# Patient Record
Sex: Female | Born: 1990 | Race: Black or African American | Hispanic: No | Marital: Single | Smoking: Never smoker
Health system: Southern US, Community
[De-identification: ages and names within clinical notes are randomized; demographics above are authoritative.]

## PROBLEM LIST (undated history)

## (undated) ENCOUNTER — Inpatient Hospital Stay (HOSPITAL_COMMUNITY): Payer: Self-pay

## (undated) DIAGNOSIS — D649 Anemia, unspecified: Secondary | ICD-10-CM

## (undated) HISTORY — PX: NO PAST SURGERIES: SHX2092

---

## 2014-03-03 DIAGNOSIS — O99019 Anemia complicating pregnancy, unspecified trimester: Secondary | ICD-10-CM | POA: Insufficient documentation

## 2015-01-13 NOTE — L&D Delivery Note (Signed)
25 y.o. G2P1001 at 1146w4d delivered a viable female infant in cephalic, LOA position. Loose nuchal cord x1, easily reduced at perineum. Right anterior shoulder delivered with ease. 60 sec delayed cord clamping. Cord clamped x2 and cut. Placenta delivered spontaneously intact, with 3VC. Fundus firm on exam with massage and pitocin. Good hemostasis noted.  Laceration: Bilateral superficial labial Suture: 4-0 Monocryl Good hemostasis noted.  Mom and baby recovering in LDR.    Apgars: 8/9 Weight: pending, skin to skin EBL: 150 cc   Jen MowElizabeth Mumaw, DO [facility] 10/31/2015, 9:06 AM

## 2015-05-11 ENCOUNTER — Encounter (HOSPITAL_COMMUNITY): Payer: Self-pay

## 2015-05-11 ENCOUNTER — Inpatient Hospital Stay (HOSPITAL_COMMUNITY)
Admission: AD | Admit: 2015-05-11 | Discharge: 2015-05-11 | Disposition: A | Discharge status: Home / Self Care | Payer: Medicaid Other | Source: Ambulatory Visit | Attending: Obstetrics and Gynecology | Admitting: Obstetrics and Gynecology

## 2015-05-11 DIAGNOSIS — D649 Anemia, unspecified: Secondary | ICD-10-CM

## 2015-05-11 DIAGNOSIS — B9689 Other specified bacterial agents as the cause of diseases classified elsewhere: Secondary | ICD-10-CM

## 2015-05-11 DIAGNOSIS — O23591 Infection of other part of genital tract in pregnancy, first trimester: Secondary | ICD-10-CM | POA: Insufficient documentation

## 2015-05-11 DIAGNOSIS — R109 Unspecified abdominal pain: Secondary | ICD-10-CM | POA: Diagnosis not present

## 2015-05-11 DIAGNOSIS — Z3A11 11 weeks gestation of pregnancy: Secondary | ICD-10-CM | POA: Insufficient documentation

## 2015-05-11 DIAGNOSIS — A499 Bacterial infection, unspecified: Secondary | ICD-10-CM

## 2015-05-11 DIAGNOSIS — O23592 Infection of other part of genital tract in pregnancy, second trimester: Secondary | ICD-10-CM

## 2015-05-11 DIAGNOSIS — N760 Acute vaginitis: Secondary | ICD-10-CM

## 2015-05-11 DIAGNOSIS — O99011 Anemia complicating pregnancy, first trimester: Secondary | ICD-10-CM

## 2015-05-11 DIAGNOSIS — O9989 Other specified diseases and conditions complicating pregnancy, childbirth and the puerperium: Secondary | ICD-10-CM

## 2015-05-11 DIAGNOSIS — O26891 Other specified pregnancy related conditions, first trimester: Secondary | ICD-10-CM | POA: Diagnosis not present

## 2015-05-11 DIAGNOSIS — O26899 Other specified pregnancy related conditions, unspecified trimester: Secondary | ICD-10-CM

## 2015-05-11 DIAGNOSIS — O99012 Anemia complicating pregnancy, second trimester: Secondary | ICD-10-CM

## 2015-05-11 DIAGNOSIS — R42 Dizziness and giddiness: Secondary | ICD-10-CM

## 2015-05-11 DIAGNOSIS — Z3A12 12 weeks gestation of pregnancy: Secondary | ICD-10-CM

## 2015-05-11 HISTORY — DX: Anemia, unspecified: D64.9

## 2015-05-11 LAB — CBC
HEMATOCRIT: 29.3 % — AB (ref 36.0–46.0)
Hemoglobin: 9.7 g/dL — ABNORMAL LOW (ref 12.0–15.0)
MCH: 29.8 pg (ref 26.0–34.0)
MCHC: 33.1 g/dL (ref 30.0–36.0)
MCV: 89.9 fL (ref 78.0–100.0)
Platelets: 274 10*3/uL (ref 150–400)
RBC: 3.26 MIL/uL — ABNORMAL LOW (ref 3.87–5.11)
RDW: 11.4 % — AB (ref 11.5–15.5)
WBC: 8 10*3/uL (ref 4.0–10.5)

## 2015-05-11 LAB — WET PREP, GENITAL
Sperm: NONE SEEN
TRICH WET PREP: NONE SEEN
Yeast Wet Prep HPF POC: NONE SEEN

## 2015-05-11 LAB — POCT PREGNANCY, URINE: Preg Test, Ur: POSITIVE — AB

## 2015-05-11 LAB — URINALYSIS, ROUTINE W REFLEX MICROSCOPIC
BILIRUBIN URINE: NEGATIVE
GLUCOSE, UA: NEGATIVE mg/dL
Hgb urine dipstick: NEGATIVE
Ketones, ur: NEGATIVE mg/dL
NITRITE: NEGATIVE
PH: 7 (ref 5.0–8.0)
Protein, ur: NEGATIVE mg/dL
SPECIFIC GRAVITY, URINE: 1.02 (ref 1.005–1.030)

## 2015-05-11 LAB — URINE MICROSCOPIC-ADD ON

## 2015-05-11 MED ORDER — METRONIDAZOLE 500 MG PO TABS: 500.0000 mg | ORAL_TABLET | Freq: Two times a day (BID) | ORAL | Status: DC

## 2015-05-11 NOTE — MAU Note (Addendum)
Abd cramping on and off all day. Stronger since 1700. Lightheaded and short of breath all day. Denies vag bleeding. Some yellow d/c. Only feels the cramping when sitting or if I have my pants buttoned and they are tight

## 2015-05-11 NOTE — Discharge Instructions (Signed)
Anemia, Nonspecific °Anemia is a condition in which the concentration of red blood cells or hemoglobin in the blood is below normal. Hemoglobin is a substance in red blood cells that carries oxygen to the tissues of the body. Anemia results in not enough oxygen reaching these tissues.  °CAUSES  °Common causes of anemia include:  °· Excessive bleeding. Bleeding may be internal or external. This includes excessive bleeding from periods (in women) or from the intestine.   °· Poor nutrition.   °· Chronic kidney, thyroid, and liver disease.  °· Bone marrow disorders that decrease red blood cell production. °· Cancer and treatments for cancer. °· HIV, AIDS, and their treatments. °· Spleen problems that increase red blood cell destruction. °· Blood disorders. °· Excess destruction of red blood cells due to infection, medicines, and autoimmune disorders. °SIGNS AND SYMPTOMS  °· Minor weakness.   °· Dizziness.   °· Headache. °· Palpitations.   °· Shortness of breath, especially with exercise.   °· Paleness. °· Cold sensitivity. °· Indigestion. °· Nausea. °· Difficulty sleeping. °· Difficulty concentrating. °Symptoms may occur suddenly or they may develop slowly.  °DIAGNOSIS  °Additional blood tests are often needed. These help your health care provider determine the best treatment. Your health care provider will check your stool for blood and look for other causes of blood loss.  °TREATMENT  °Treatment varies depending on the cause of the anemia. Treatment can include:  °· Supplements of iron, vitamin B12, or folic acid.   °· Hormone medicines.   °· A blood transfusion. This may be needed if blood loss is severe.   °· Hospitalization. This may be needed if there is significant continual blood loss.   °· Dietary changes. °· Spleen removal. °HOME CARE INSTRUCTIONS °Keep all follow-up appointments. It often takes many weeks to correct anemia, and having your health care provider check on your condition and your response to  treatment is very important. °SEEK IMMEDIATE MEDICAL CARE IF:  °· You develop extreme weakness, shortness of breath, or chest pain.   °· You become dizzy or have trouble concentrating. °· You develop heavy vaginal bleeding.   °· You develop a rash.   °· You have bloody or black, tarry stools.   °· You faint.   °· You vomit up blood.   °· You vomit repeatedly.   °· You have abdominal pain. °· You have a fever or persistent symptoms for more than 2-3 days.   °· You have a fever and your symptoms suddenly get worse.   °· You are dehydrated.   °MAKE SURE YOU: °· Understand these instructions. °· Will watch your condition. °· Will get help right away if you are not doing well or get worse. °  °This information is not intended to replace advice given to you by your health care provider. Make sure you discuss any questions you have with your health care provider. °  °Document Released: 02/06/2004 Document Revised: 08/31/2012 Document Reviewed: 06/24/2012 °Elsevier Interactive Patient Education ©2016 Elsevier Inc. ° ° °Bacterial Vaginosis °Bacterial vaginosis is a vaginal infection that occurs when the normal balance of bacteria in the vagina is disrupted. It results from an overgrowth of certain bacteria. This is the most common vaginal infection in women of childbearing age. Treatment is important to prevent complications, especially in pregnant women, as it can cause a premature delivery. °CAUSES  °Bacterial vaginosis is caused by an increase in harmful bacteria that are normally present in smaller amounts in the vagina. Several different kinds of bacteria can cause bacterial vaginosis. However, the reason that the condition develops is not fully understood. °RISK FACTORS °  or behaviors can put you at an increased risk of developing bacterial vaginosis, including:  Having a new sex partner or multiple sex partners.  Douching.  Using an intrauterine device (IUD) for contraception. Women do not get  bacterial vaginosis from toilet seats, bedding, swimming pools, or contact with objects around them. SIGNS AND SYMPTOMS  Some women with bacterial vaginosis have no signs or symptoms. Common symptoms include:  Grey vaginal discharge.  A fishlike odor with discharge, especially after sexual intercourse.  Itching or burning of the vagina and vulva.  Burning or pain with urination. DIAGNOSIS  Your health care provider will take a medical history and examine the vagina for signs of bacterial vaginosis. A sample of vaginal fluid may be taken. Your health care provider will look at this sample under a microscope to check for bacteria and abnormal cells. A vaginal pH test may also be done.  TREATMENT  Bacterial vaginosis may be treated with antibiotic medicines. These may be given in the form of a pill or a vaginal cream. A second round of antibiotics may be prescribed if the condition comes back after treatment. Because bacterial vaginosis increases your risk for sexually transmitted diseases, getting treated can help reduce your risk for chlamydia, gonorrhea, HIV, and herpes. HOME CARE INSTRUCTIONS   Only take over-the-counter or prescription medicines as directed by your health care provider.  If antibiotic medicine was prescribed, take it as directed. Make sure you finish it even if you start to feel better.  Tell all sexual partners that you have a vaginal infection. They should see their health care provider and be treated if they have problems, such as a mild rash or itching.  During treatment, it is important that you follow these instructions:  Avoid sexual activity or use condoms correctly.  Do not douche.  Avoid alcohol as directed by your health care provider.  Avoid breastfeeding as directed by your health care provider. SEEK MEDICAL CARE IF:   Your symptoms are not improving after 3 days of treatment.  You have increased discharge or pain.  You have a fever. MAKE SURE  YOU:   Understand these instructions.  Will watch your condition.  Will get help right away if you are not doing well or get worse. FOR MORE INFORMATION  Centers for Disease Control and Prevention, Division of STD Prevention: SolutionApps.co.zawww.cdc.gov/std American Sexual Health Association (ASHA): www.ashastd.org    This information is not intended to replace advice given to you by your health care provider. Make sure you discuss any questions you have with your health care provider.   Document Released: 12/29/2004 Document Revised: 01/19/2014 Document Reviewed: 08/10/2012 Elsevier Interactive Patient Education [employer]2016 Elsevier Inc.

## 2015-05-11 NOTE — MAU Provider Note (Signed)
History     CSN: [id number]  Arrival date and time: 05/11/15 0101   First Provider Initiated Contact with Patient 05/11/15 0151      Chief Complaint  Patient presents with  . Abdominal Cramping  . Dizziness   HPI   [name] is a 25 y.o. female with a history of anemia G2P1001 at 3046w4d presenting with complaints of dizziness that she has felt more since she found out she was pregnant. She feels dizzy upon standing almost every time.  She has never passed out. She feels like she eats a healthy diet.  No history of cardiac problems. She had this dizziness with her last pregnancy however never followed up.   She has been experiencing lower abdominal pain that started 2 days ago. It worsens when she stands up or change positions. She tried taking tylenol extra strength which has not helped.   OB History    Gravida Para Term Preterm AB TAB SAB Ectopic Multiple Living   2 1 1       1       Past Medical History  Diagnosis Date  . Anemia     History reviewed. No pertinent past surgical history.  History reviewed. No pertinent family history.  Social History  Substance Use Topics  . Smoking status: Never Smoker   . Smokeless tobacco: None  . Alcohol Use: No    Allergies: No Known Allergies  No prescriptions prior to admission   Results for orders placed or performed during the hospital encounter of 05/11/15 (from the past 72 hour(s))  Urinalysis, Routine w reflex microscopic (not at [facility])     Status: Abnormal   Collection Time: 05/11/15  1:25 AM  Result Value Ref Range   Color, Urine YELLOW YELLOW   APPearance CLEAR CLEAR   Specific Gravity, Urine 1.020 1.005 - 1.030   pH 7.0 5.0 - 8.0   Glucose, UA NEGATIVE NEGATIVE mg/dL   Hgb urine dipstick NEGATIVE NEGATIVE   Bilirubin Urine NEGATIVE NEGATIVE   Ketones, ur NEGATIVE NEGATIVE mg/dL   Protein, ur NEGATIVE NEGATIVE mg/dL   Nitrite NEGATIVE NEGATIVE   Leukocytes, UA TRACE (A) NEGATIVE  Urine microscopic-add on      Status: Abnormal   Collection Time: 05/11/15  1:25 AM  Result Value Ref Range   Squamous Epithelial / LPF 0-5 (A) NONE SEEN   WBC, UA 0-5 0 - 5 WBC/hpf   RBC / HPF 0-5 0 - 5 RBC/hpf   Bacteria, UA FEW (A) NONE SEEN  Pregnancy, urine POC     Status: Abnormal   Collection Time: 05/11/15  1:41 AM  Result Value Ref Range   Preg Test, Ur POSITIVE (A) NEGATIVE    Comment:        THE SENSITIVITY OF THIS METHODOLOGY IS >24 mIU/mL   CBC     Status: Abnormal   Collection Time: 05/11/15  1:54 AM  Result Value Ref Range   WBC 8.0 4.0 - 10.5 K/uL   RBC 3.26 (L) 3.87 - 5.11 MIL/uL   Hemoglobin 9.7 (L) 12.0 - 15.0 g/dL   HCT 65.729.3 (L) 84.636.0 - 96.246.0 %   MCV 89.9 78.0 - 100.0 fL   MCH 29.8 26.0 - 34.0 pg   MCHC 33.1 30.0 - 36.0 g/dL   RDW 95.211.4 (L) 84.111.5 - 32.415.5 %   Platelets 274 150 - 400 K/uL  Wet prep, genital     Status: Abnormal   Collection Time: 05/11/15  2:05 AM  Result Value  Ref Range   Yeast Wet Prep HPF POC NONE SEEN NONE SEEN   Trich, Wet Prep NONE SEEN NONE SEEN   Clue Cells Wet Prep HPF POC PRESENT (A) NONE SEEN   WBC, Wet Prep HPF POC MANY (A) NONE SEEN    Comment: MANY BACTERIA SEEN   Sperm NONE SEEN     Review of Systems  Constitutional: Negative for fever and chills.  Respiratory: Negative for shortness of breath (None now, has had occasional with this pregnancy. ).   Gastrointestinal: Positive for abdominal pain. Negative for nausea and vomiting.  Genitourinary: Positive for frequency. Negative for dysuria and urgency.   Physical Exam   Blood pressure 108/67, pulse 80, temperature 98.1 F (36.7 C), resp. rate 20, height  (1.6 m), weight 126 lb 3.2 oz (57.244 kg), last menstrual period 02/19/2015, SpO2 100 %.  Physical Exam  Constitutional: She is oriented to person, place, and time. She appears well-developed and well-nourished. No distress.  HENT:  Head: Normocephalic.  Eyes: Pupils are equal, round, and reactive to light.  GI: Soft. She exhibits no  distension. There is no tenderness. There is no rebound and no guarding.  Genitourinary:  Wet prep/GC collected with speculum Bimanual exam: cervix closed-posterior   Musculoskeletal: Normal range of motion.  Neurological: She is alert and oriented to person, place, and time.  Skin: Skin is warm. She is not diaphoretic.  Psychiatric: Her behavior is normal.    MAU Course  Procedures  None  MDM  + fetal hear tones via doppler.  Orthostatic vitals normal  Assessment and Plan   A:  1. Anemia in pregnancy, first trimester   2. Abdominal cramping affecting pregnancy   3. Dizzinesses   4. Bacterial vaginosis      P:  Discharge home in stable condition Increase PO iron to BID  Change positions slowly  Increase PO fluid intake  RX: Flagyl    Duane Lope, NP  05/13/2015, 12:12 PM

## 2015-05-11 NOTE — Progress Notes (Signed)
Standing at 0

## 2015-05-11 NOTE — Progress Notes (Signed)
Standing at 3

## 2015-05-13 LAB — GC/CHLAMYDIA PROBE AMP (~~LOC~~) NOT AT ARMC
CHLAMYDIA, DNA PROBE: NEGATIVE
Neisseria Gonorrhea: NEGATIVE

## 2015-05-21 ENCOUNTER — Ambulatory Visit (INDEPENDENT_AMBULATORY_CARE_PROVIDER_SITE_OTHER): Payer: Medicaid Other | Admitting: Certified Nurse Midwife

## 2015-05-21 ENCOUNTER — Encounter: Payer: Self-pay | Admitting: Certified Nurse Midwife

## 2015-05-21 VITALS — Wt 124.0 lb

## 2015-05-21 DIAGNOSIS — Z3490 Encounter for supervision of normal pregnancy, unspecified, unspecified trimester: Secondary | ICD-10-CM | POA: Insufficient documentation

## 2015-05-21 DIAGNOSIS — O99012 Anemia complicating pregnancy, second trimester: Secondary | ICD-10-CM

## 2015-05-21 DIAGNOSIS — Z3492 Encounter for supervision of normal pregnancy, unspecified, second trimester: Secondary | ICD-10-CM

## 2015-05-21 DIAGNOSIS — Z3482 Encounter for supervision of other normal pregnancy, second trimester: Secondary | ICD-10-CM

## 2015-05-21 LAB — POCT URINALYSIS DIPSTICK
Bilirubin, UA: NEGATIVE
Blood, UA: NEGATIVE
Glucose, UA: NEGATIVE
Ketones, UA: NEGATIVE
Leukocytes, UA: NEGATIVE
Nitrite, UA: NEGATIVE
PH UA: 5
PROTEIN UA: NEGATIVE
SPEC GRAV UA: 1.015
UROBILINOGEN UA: NEGATIVE

## 2015-05-21 MED ORDER — VITAFOL FE+ 90-1-200 & 50 MG PO CPPK: 2.0000 | ORAL_CAPSULE | Freq: Every day | ORAL | Status: DC

## 2015-05-21 MED ORDER — IRON POLYSACCH CMPLX-B12-FA 150-0.025-1 MG PO CAPS: 1.0000 | ORAL_CAPSULE | Freq: Every day | ORAL | Status: DC

## 2015-05-21 NOTE — Progress Notes (Signed)
Subjective:    Hannah Patel is being seen today for her first obstetrical visit.  This is not a planned pregnancy. She is at 55106w0d gestation. Her obstetrical history is significant for none. Relationship with FOB: significant other, living together. Patient does intend to breast feed. Pregnancy history fully reviewed.  Was on OCPs, desires to continue with OCPs.    The information documented in the HPI was reviewed and verified.  Menstrual History: OB History    Gravida Para Term Preterm AB TAB SAB Ectopic Multiple Living   2 1 1       1       Menarche age: [age]- 25 years of age  Patient's last menstrual period was 02/19/2015 (lmp unknown).    Past Medical History  Diagnosis Date  . Anemia     No past surgical history on file.   (Not in a hospital admission) No Known Allergies  Social History  Substance Use Topics  . Smoking status: Never Smoker   . Smokeless tobacco: Not on file  . Alcohol Use: No    No family history on file.   Review of Systems Constitutional: negative for weight loss, + virtigo Gastrointestinal: negative for vomiting Genitourinary:negative for genital lesions and vaginal discharge and dysuria Musculoskeletal:negative for back pain Behavioral/Psych: negative for abusive relationship, depression, illegal drug usage and tobacco use    Objective:    Wt 124 lb (56.246 kg)  LMP 02/19/2015 (LMP Unknown) General Appearance:    Alert, cooperative, no distress, appears stated age  Head:    Normocephalic, without obvious abnormality, atraumatic  Eyes:    PERRL, conjunctiva/corneas clear, EOM's intact, fundi    benign, both eyes  Ears:    Normal TM's and external ear canals, both ears  Nose:   Nares normal, septum midline, mucosa normal, no drainage    or sinus tenderness  Throat:   Lips, mucosa, and tongue normal; teeth and gums normal  Neck:   Supple, symmetrical, trachea midline, no adenopathy;    thyroid:  no enlargement/tenderness/nodules; no carotid  bruit or JVD  Back:     Symmetric, no curvature, ROM normal, no CVA tenderness  Lungs:     Clear to auscultation bilaterally, respirations unlabored  Chest Wall:    No tenderness or deformity   Heart:    Regular rate and rhythm, S1 and S2 normal, no murmur, rub   or gallop  Breast Exam:    No tenderness, masses, or nipple abnormality  Abdomen:     Soft, non-tender, bowel sounds active all four quadrants,    no masses, no organomegaly  Genitalia:    Normal female without lesion, discharge or tenderness  Extremities:   Extremities normal, atraumatic, no cyanosis or edema  Pulses:   2+ and symmetric all extremities  Skin:   Skin color, texture, turgor normal, no rashes or lesions  Lymph nodes:   Cervical, supraclavicular, and axillary nodes normal  Neurologic:   CNII-XII intact, normal strength, sensation and reflexes    throughout          Cervix:  Long, thick, closed and posterior.  FHR: 155 by doppler     Lab Review Urine pregnancy test Labs reviewed yes Radiologic studies reviewed no Assessment:    Pregnancy at 75106w0d weeks   N&V in early pregnancy  Chronic anemia  Plan:      Prenatal vitamins.  Counseling provided regarding continued use of seat belts, cessation of alcohol consumption, smoking or use of illicit drugs; infection precautions i.e.,  influenza/TDAP immunizations, toxoplasmosis,CMV, parvovirus, listeria and varicella; workplace safety, exercise during pregnancy; routine dental care, safe medications, sexual activity, hot tubs, saunas, pools, travel, caffeine use, fish and methlymercury, potential toxins, hair treatments, varicose veins Weight gain recommendations per IOM guidelines reviewed: underweight/BMI< 18.5--> gain 28 - 40 lbs; normal weight/BMI 18.5 - 24.9--> gain 25 - 35 lbs; overweight/BMI 25 - 29.9--> gain 15 - 25 lbs; obese/BMI >30->gain  11 - 20 lbs Problem list reviewed and updated. FIRST/CF mutation testing/NIPT/QUAD SCREEN/fragile X/Ashkenazi Jewish  population testing/Spinal muscular atrophy discussed: requested. Role of ultrasound in pregnancy discussed; fetal survey: requested. Amniocentesis discussed: not indicated. VBAC calculator score: VBAC consent form provided Meds ordered this encounter  Medications  . ferrous sulfate 325 (65 FE) MG tablet    Sig: Take 325 mg by mouth 2 (two) times daily with a meal.  . Prenat-FePoly-Metf-FA-DHA-DSS (VITAFOL FE+) 90-1-200 & 50 MG CPPK    Sig: Take 2 tablets by mouth daily.    Dispense:  60 each    Refill:  12   Orders Placed This Encounter  Procedures  . US OB Comp Less 14 Wks    Standing Status: Future     Number of Occurrences:      Standing Expiration Date: 07/20/2016    Order Specific Question:  Reason for Exam (SYMPTOM  OR DIAGNOSIS REQUIRED)    Answer:  dating    Order Specific Question:  Preferred imaging location?    Answer:  Internal  . POCT Urinalysis Dipstick    Follow up in 4 weeks. 50% of 30 min visit spent on counseling and coordination of care.

## 2015-05-23 LAB — PRENATAL PROFILE I(LABCORP)
Antibody Screen: NEGATIVE
BASOS ABS: 0 10*3/uL (ref 0.0–0.2)
Basos: 0 %
EOS (ABSOLUTE): 0.1 10*3/uL (ref 0.0–0.4)
Eos: 1 %
HEMOGLOBIN: 10.5 g/dL — AB (ref 11.1–15.9)
Hematocrit: 32.2 % — ABNORMAL LOW (ref 34.0–46.6)
Hepatitis B Surface Ag: NEGATIVE
IMMATURE GRANS (ABS): 0 10*3/uL (ref 0.0–0.1)
IMMATURE GRANULOCYTES: 0 %
Lymphocytes Absolute: 2.1 10*3/uL (ref 0.7–3.1)
Lymphs: 27 %
MCH: 29.4 pg (ref 26.6–33.0)
MCHC: 32.6 g/dL (ref 31.5–35.7)
MCV: 90 fL (ref 79–97)
MONOCYTES: 5 %
MONOS ABS: 0.4 10*3/uL (ref 0.1–0.9)
Neutrophils Absolute: 5 10*3/uL (ref 1.4–7.0)
Neutrophils: 67 %
Platelets: 317 10*3/uL (ref 150–379)
RBC: 3.57 x10E6/uL — ABNORMAL LOW (ref 3.77–5.28)
RDW: 11.9 % — AB (ref 12.3–15.4)
RPR Ser Ql: NONREACTIVE
RUBELLA: 5.47 {index} (ref >0.99)
Rh Factor: POSITIVE
WBC: 7.6 10*3/uL (ref 3.4–10.8)

## 2015-05-23 LAB — HEMOGLOBINOPATHY EVALUATION
HEMOGLOBIN F QUANTITATION: 0 % (ref 0.0–2.0)
HGB A: 97.5 % (ref 94.0–98.0)
HGB C: 0 %
HGB S: 0 %
Hemoglobin A2 Quantitation: 2.5 % (ref 0.7–3.1)

## 2015-05-23 LAB — TSH: TSH: 0.494 u[IU]/mL (ref 0.450–4.500)

## 2015-05-23 LAB — HIV ANTIBODY (ROUTINE TESTING W REFLEX): HIV Screen 4th Generation wRfx: NONREACTIVE

## 2015-05-23 LAB — CULTURE, OB URINE

## 2015-05-23 LAB — VARICELLA ZOSTER ANTIBODY, IGG: VARICELLA: 3106 {index} (ref >165)

## 2015-05-23 LAB — URINE CULTURE, OB REFLEX

## 2015-05-24 LAB — PAP IG W/ RFLX HPV ASCU: PAP Smear Comment: 0

## 2015-05-25 LAB — NUSWAB VG+, CANDIDA 6SP
CANDIDA KRUSEI, NAA: NEGATIVE
CANDIDA LUSITANIAE, NAA: NEGATIVE
CANDIDA PARAPSILOSIS, NAA: NEGATIVE
CANDIDA TROPICALIS, NAA: NEGATIVE
Candida albicans, NAA: NEGATIVE
Candida glabrata, NAA: NEGATIVE
Chlamydia trachomatis, NAA: NEGATIVE
Neisseria gonorrhoeae, NAA: NEGATIVE
TRICH VAG BY NAA: NEGATIVE

## 2015-05-28 LAB — MATERNIT21 PLUS CORE+SCA
CHROMOSOME 13: NEGATIVE
CHROMOSOME 18: NEGATIVE
CHROMOSOME 21: NEGATIVE
PDF: 0
Y CHROMOSOME: NOT DETECTED

## 2015-05-29 ENCOUNTER — Other Ambulatory Visit: Payer: Self-pay | Admitting: Certified Nurse Midwife

## 2015-05-30 ENCOUNTER — Other Ambulatory Visit: Payer: Self-pay | Admitting: Certified Nurse Midwife

## 2015-05-30 ENCOUNTER — Ambulatory Visit (INDEPENDENT_AMBULATORY_CARE_PROVIDER_SITE_OTHER): Payer: Medicaid Other

## 2015-05-30 DIAGNOSIS — O3680X1 Pregnancy with inconclusive fetal viability, fetus 1: Secondary | ICD-10-CM

## 2015-05-30 DIAGNOSIS — Z3482 Encounter for supervision of other normal pregnancy, second trimester: Secondary | ICD-10-CM

## 2015-05-31 ENCOUNTER — Other Ambulatory Visit: Payer: Self-pay | Admitting: Certified Nurse Midwife

## 2015-05-31 DIAGNOSIS — Z3482 Encounter for supervision of other normal pregnancy, second trimester: Secondary | ICD-10-CM

## 2015-06-18 ENCOUNTER — Encounter: Payer: Medicaid Other | Admitting: Certified Nurse Midwife

## 2015-06-25 ENCOUNTER — Encounter: Payer: Medicaid Other | Admitting: Certified Nurse Midwife

## 2015-06-25 ENCOUNTER — Other Ambulatory Visit: Payer: Medicaid Other

## 2015-07-04 ENCOUNTER — Ambulatory Visit (INDEPENDENT_AMBULATORY_CARE_PROVIDER_SITE_OTHER): Payer: Medicaid Other | Admitting: Certified Nurse Midwife

## 2015-07-04 ENCOUNTER — Encounter: Payer: Self-pay | Admitting: Certified Nurse Midwife

## 2015-07-04 ENCOUNTER — Ambulatory Visit (INDEPENDENT_AMBULATORY_CARE_PROVIDER_SITE_OTHER): Payer: Medicaid Other

## 2015-07-04 VITALS — BP 101/60 | HR 77 | Wt 130.0 lb

## 2015-07-04 DIAGNOSIS — Z3482 Encounter for supervision of other normal pregnancy, second trimester: Secondary | ICD-10-CM

## 2015-07-04 DIAGNOSIS — R51 Headache: Secondary | ICD-10-CM

## 2015-07-04 DIAGNOSIS — O26892 Other specified pregnancy related conditions, second trimester: Secondary | ICD-10-CM

## 2015-07-04 DIAGNOSIS — Z36 Encounter for antenatal screening of mother: Secondary | ICD-10-CM

## 2015-07-04 DIAGNOSIS — R519 Headache, unspecified: Secondary | ICD-10-CM

## 2015-07-04 MED ORDER — BUTALBITAL-APAP-CAFFEINE 50-325-40 MG PO TABS: 1.0000 | ORAL_TABLET | Freq: Four times a day (QID) | ORAL | Status: DC | PRN

## 2015-07-04 NOTE — Progress Notes (Signed)
Subjective:    Hannah Patel is a 25 y.o. female being seen today for her obstetrical visit. She is at 6966w4d gestation. Patient reports: headache, no bleeding, no contractions, no cramping and no leaking . Fetal movement: normal.  Reports daily headaches without relief with Tylenol.  Problem List Items Addressed This Visit    None    Visit Diagnoses    Headache in pregnancy, antepartum, second trimester    -  Primary    Relevant Medications    butalbital-acetaminophen-caffeine (FIORICET) 50-325-40 MG tablet      Patient Active Problem List   Diagnosis Date Noted  . Encounter for supervision of other normal pregnancy in second trimester 05/21/2015   Objective:    BP 101/60 mmHg  Pulse 77  Wt 130 lb (58.968 kg)  LMP 02/19/2015 (LMP Unknown) FHT: 148 BPM  Uterine Size: size equals dates     Assessment:    Pregnancy @ 966w4d    Headache Plan:    OBGCT: discussed. Signs and symptoms of preterm labor: discussed.  Labs, problem list reviewed and updated 2 hr GTT planned Follow up in 4 weeks.

## 2015-07-05 NOTE — Progress Notes (Signed)
I agree with note by NP Student Andrew Brake.  Was present for exam.  R.Shauntel Prest CNM 

## 2015-07-31 ENCOUNTER — Ambulatory Visit (INDEPENDENT_AMBULATORY_CARE_PROVIDER_SITE_OTHER): Payer: Medicaid Other | Admitting: Certified Nurse Midwife

## 2015-07-31 VITALS — BP 98/62 | HR 78 | Wt 133.0 lb

## 2015-07-31 DIAGNOSIS — R51 Headache: Secondary | ICD-10-CM

## 2015-07-31 DIAGNOSIS — R519 Headache, unspecified: Secondary | ICD-10-CM

## 2015-07-31 DIAGNOSIS — Z3482 Encounter for supervision of other normal pregnancy, second trimester: Secondary | ICD-10-CM

## 2015-07-31 DIAGNOSIS — O26892 Other specified pregnancy related conditions, second trimester: Secondary | ICD-10-CM

## 2015-07-31 MED ORDER — BUTALBITAL-APAP-CAFFEINE 50-325-40 MG PO TABS: 1.0000 | ORAL_TABLET | Freq: Four times a day (QID) | ORAL | Status: DC | PRN

## 2015-07-31 NOTE — Progress Notes (Signed)
Subjective:    Hannah Patel is a 25 y.o. female being seen today for her obstetrical visit. She is at 1946w3d gestation. Patient reports: no complaints . Lost her fioricet RX, reprinted for her.  Fetal movement: normal.  Problem List Items Addressed This Visit      Other   Encounter for supervision of other normal pregnancy in second trimester - Primary   Relevant Orders   POCT urinalysis dipstick    Other Visit Diagnoses    Headache in pregnancy, antepartum, second trimester        Relevant Medications    butalbital-acetaminophen-caffeine (FIORICET) 50-325-40 MG tablet      Patient Active Problem List   Diagnosis Date Noted  . Encounter for supervision of other normal pregnancy in second trimester 05/21/2015   Objective:    BP 98/62 mmHg  Pulse 78  Wt 133 lb (60.328 kg)  LMP 02/19/2015 (LMP Unknown) FHT: 155 BPM  Uterine Size: size equals dates     Assessment:    Pregnancy @ 4746w3d    Doing well  Plan:    OBGCT: discussed and ordered for next visit. Signs and symptoms of preterm labor: discussed.  Labs, problem list reviewed and updated 2 hr GTT planned Follow up in 2 weeks.

## 2015-08-14 ENCOUNTER — Other Ambulatory Visit: Payer: Medicaid Other

## 2015-08-14 ENCOUNTER — Ambulatory Visit (INDEPENDENT_AMBULATORY_CARE_PROVIDER_SITE_OTHER): Payer: Medicaid Other | Admitting: Certified Nurse Midwife

## 2015-08-14 VITALS — BP 102/69 | HR 134 | Temp 98.3°F | Wt 137.4 lb

## 2015-08-14 DIAGNOSIS — Z3492 Encounter for supervision of normal pregnancy, unspecified, second trimester: Secondary | ICD-10-CM

## 2015-08-14 DIAGNOSIS — Z3482 Encounter for supervision of other normal pregnancy, second trimester: Secondary | ICD-10-CM

## 2015-08-14 LAB — POCT URINALYSIS DIPSTICK
Bilirubin, UA: NEGATIVE
Blood, UA: NEGATIVE
Glucose, UA: NEGATIVE
KETONES UA: NEGATIVE
Nitrite, UA: NEGATIVE
PH UA: 6
PROTEIN UA: NEGATIVE
SPEC GRAV UA: 1.015
UROBILINOGEN UA: 0.2

## 2015-08-14 MED ORDER — TETANUS-DIPHTH-ACELL PERTUSSIS 5-2.5-18.5 LF-MCG/0.5 IM SUSP
0.5000 mL | Freq: Once | INTRAMUSCULAR | Status: AC
  Administered 2015-08-14: 0.5 mL via INTRAMUSCULAR

## 2015-08-14 NOTE — Addendum Note (Signed)
Addended by: Lear Ng on: 08/14/2015 11:10 AM   Modules accepted: Orders

## 2015-08-14 NOTE — Progress Notes (Signed)
Subjective:  Hannah Patel is a 25 y.o. G2P1001 at [redacted]w[redacted]d being seen today for ongoing prenatal care.  She is currently monitored for the following issues for this low-risk pregnancy and has Encounter for supervision of other normal pregnancy in second trimester on her problem list.  Patient reports no complaints.  Contractions: Not present. Vag. Bleeding: None.  Movement: Present. Denies leaking of fluid.   The following portions of the patient's history were reviewed and updated as appropriate: allergies, current medications, past family history, past medical history, past social history, past surgical history and problem list. Problem list updated.  Objective:   Vitals:   08/14/15 0842  BP: 102/69  Pulse: (!) 134  Temp: 98.3 F (36.8 C)  Weight: 137 lb 6.4 oz (62.3 kg)    Fetal Status: Fetal Heart Rate (bpm): 145 Fundal Height: 27 cm Movement: Present     General:  Alert, oriented and cooperative. Patient is in no acute distress.  Skin: Skin is warm and dry. No rash noted.   Cardiovascular: Normal heart rate noted  Respiratory: Normal respiratory effort, no problems with respiration noted  Abdomen: Soft, gravid, appropriate for gestational age. Pain/Pressure: Absent     Pelvic:  Cervical exam deferred        Extremities: Normal range of motion.  Edema: None  Mental Status: Normal mood and affect. Normal behavior. Normal judgment and thought content.   Urinalysis:      Assessment and Plan:  Pregnancy: G2P1001 at [redacted]w[redacted]d  1. Encounter for supervision of other normal pregnancy in second trimester - 28 weeks labs - TDaP  Preterm labor symptoms and general obstetric precautions including but not limited to vaginal bleeding, contractions, leaking of fluid and fetal movement were reviewed in detail with the patient. Please refer to After Visit Summary for other counseling recommendations.  F/U 2 weeks   Dorathy Kinsman, [place]   Patient ID: Hannah Patel, female   DOB: 03-05-1990, 25  y.o.   MRN: [id number]

## 2015-08-14 NOTE — Progress Notes (Signed)
Pt states everything is "fine" at this point.

## 2015-08-14 NOTE — Addendum Note (Signed)
Addended by: Elby Beck F on: 08/14/2015 04:57 PM   Modules accepted: Orders

## 2015-08-14 NOTE — Patient Instructions (Signed)

## 2015-08-15 LAB — CBC
HEMATOCRIT: 31.7 % — AB (ref 34.0–46.6)
Hemoglobin: 9.8 g/dL — ABNORMAL LOW (ref 11.1–15.9)
MCH: 29.8 pg (ref 26.6–33.0)
MCHC: 30.9 g/dL — AB (ref 31.5–35.7)
MCV: 96 fL (ref 79–97)
Platelets: 237 10*3/uL (ref 150–379)
RBC: 3.29 x10E6/uL — ABNORMAL LOW (ref 3.77–5.28)
RDW: 12.3 % (ref 12.3–15.4)
WBC: 9.1 10*3/uL (ref 3.4–10.8)

## 2015-08-15 LAB — RPR: RPR Ser Ql: NONREACTIVE

## 2015-08-15 LAB — HIV ANTIBODY (ROUTINE TESTING W REFLEX): HIV Screen 4th Generation wRfx: NONREACTIVE

## 2015-08-15 LAB — GLUCOSE TOLERANCE, 2 HOURS W/ 1HR
Glucose, 1 hour: 95 mg/dL (ref 65–179)
Glucose, 2 hour: 106 mg/dL (ref 65–152)
Glucose, Fasting: 74 mg/dL (ref 65–91)

## 2015-08-19 ENCOUNTER — Other Ambulatory Visit: Payer: Self-pay | Admitting: Certified Nurse Midwife

## 2015-08-19 DIAGNOSIS — O99013 Anemia complicating pregnancy, third trimester: Secondary | ICD-10-CM

## 2015-08-19 MED ORDER — IRON POLYSACCH CMPLX-B12-FA 150-0.025-1 MG PO CAPS: 1.0000 | ORAL_CAPSULE | Freq: Every day | ORAL | 4 refills | Status: DC

## 2015-08-20 ENCOUNTER — Telehealth: Payer: Self-pay

## 2015-08-20 NOTE — Telephone Encounter (Signed)
-----   Message from Roe Coombsachelle A Denney, CNM sent at 08/19/2015 10:11 PM EDT ----- Please let her know that she is anemic.  I have sent in Niferex for her to take with her PNV.  Thank you.  R.Denney CNM

## 2015-08-20 NOTE — Telephone Encounter (Signed)
Left message for patient that she has prescription at her pharmacy for her. Requested that she return call to office for results. Armandina StammerJennifer Howard RNBSN

## 2015-08-22 ENCOUNTER — Telehealth: Payer: Self-pay

## 2015-08-22 NOTE — Telephone Encounter (Signed)
Patient returned call to office and made aware of need for additional iron pill with her prenatal. Patient made aware of anemia. Patient states understanding. Armandina StammerJennifer Lynetta Tomczak RNBSN

## 2015-08-28 ENCOUNTER — Encounter: Payer: Self-pay | Admitting: Certified Nurse Midwife

## 2015-08-28 ENCOUNTER — Encounter: Payer: Self-pay | Admitting: Pediatrics

## 2015-09-18 ENCOUNTER — Ambulatory Visit (INDEPENDENT_AMBULATORY_CARE_PROVIDER_SITE_OTHER): Payer: Medicaid Other | Admitting: Certified Nurse Midwife

## 2015-09-18 ENCOUNTER — Encounter: Payer: Self-pay | Admitting: Certified Nurse Midwife

## 2015-09-18 VITALS — BP 96/60 | HR 73 | Wt 138.0 lb

## 2015-09-18 DIAGNOSIS — O26843 Uterine size-date discrepancy, third trimester: Secondary | ICD-10-CM

## 2015-09-18 DIAGNOSIS — Z3483 Encounter for supervision of other normal pregnancy, third trimester: Secondary | ICD-10-CM

## 2015-09-18 DIAGNOSIS — Z3482 Encounter for supervision of other normal pregnancy, second trimester: Secondary | ICD-10-CM

## 2015-09-18 LAB — POCT URINALYSIS DIPSTICK
BILIRUBIN UA: NEGATIVE
Blood, UA: NEGATIVE
Glucose, UA: NEGATIVE
Ketones, UA: NEGATIVE
LEUKOCYTES UA: NEGATIVE
NITRITE UA: NEGATIVE
PH UA: 6.5
PROTEIN UA: NEGATIVE
Spec Grav, UA: 1.015
Urobilinogen, UA: NEGATIVE

## 2015-09-18 NOTE — Progress Notes (Signed)
Subjective:    Hannah Patel is a 25 y.o. female being seen today for her obstetrical visit. She is at 9855w3d gestation. Patient reports backache, no bleeding, no leaking, occasional contractions and states that she has had contractions at night about every 15 minutes apart at times, encouraged increased water intake and Tylenol OTC for contractions. Fetal movement: normal.  Problem List Items Addressed This Visit    None    Visit Diagnoses    Encounter for supervision of other normal pregnancy in third trimester    -  Primary   Relevant Orders   POCT urinalysis dipstick (Completed)     Patient Active Problem List   Diagnosis Date Noted  . Encounter for supervision of other normal pregnancy in second trimester 05/21/2015  . Normal labor 09/23/2014  . Anemia, antepartum 03/03/2014   Objective:    BP 96/60   Pulse 73   Wt 138 lb (62.6 kg)   LMP 02/19/2015 (LMP Unknown)   BMI 24.45 kg/m  FHT:  145 BPM  Uterine Size: 30 cm and size less than dates  Presentation: cephalic     Assessment:    Pregnancy @ 7455w3d weeks    S<D  Braxton Hicks contractions  Plan:       labs reviewed, problem list updated Consent signed. GBS planning TDAP offered  Rhogam given for RH negative Pediatrician: discussed. Infant feeding: plans to breastfeed. Maternity leave: N/A. Cigarette smoking: never smoked. Orders Placed This Encounter  Procedures  . POCT urinalysis dipstick   Meds ordered this encounter  Medications  . famotidine (PEPCID) 20 MG tablet    Sig: Take by mouth.   Follow up in 2 Weeks.

## 2015-09-24 ENCOUNTER — Encounter (HOSPITAL_COMMUNITY): Payer: Self-pay | Admitting: Certified Nurse Midwife

## 2015-09-30 ENCOUNTER — Ambulatory Visit (HOSPITAL_COMMUNITY): Payer: Medicaid Other

## 2015-10-03 ENCOUNTER — Ambulatory Visit (INDEPENDENT_AMBULATORY_CARE_PROVIDER_SITE_OTHER): Payer: Medicaid Other | Admitting: Family Medicine

## 2015-10-03 DIAGNOSIS — Z3480 Encounter for supervision of other normal pregnancy, unspecified trimester: Secondary | ICD-10-CM | POA: Diagnosis not present

## 2015-10-03 NOTE — Progress Notes (Signed)
   PRENATAL VISIT NOTE  Subjective:  Hannah Patel is a 25 y.o. G2P1001 at 2850w4d being seen today for ongoing prenatal care.  She is currently monitored for the following issues for this low-risk pregnancy and has Supervision of normal pregnancy, antepartum and Anemia, antepartum on her problem list.  Patient reports no complaints.  Contractions: Irregular. Vag. Bleeding: None.  Movement: Present. Denies leaking of fluid.   The following portions of the patient's history were reviewed and updated as appropriate: allergies, current medications, past family history, past medical history, past social history, past surgical history and problem list. Problem list updated.  Objective:   Vitals:   10/03/15 1102  BP: (!) 106/59  Pulse: 78  Temp: 97 F (36.1 C)  Weight: 140 lb (63.5 kg)    Fetal Status: Fetal Heart Rate (bpm): 143 Fundal Height: 31 cm Movement: Present     General:  Alert, oriented and cooperative. Patient is in no acute distress.  Skin: Skin is warm and dry. No rash noted.   Cardiovascular: Normal heart rate noted  Respiratory: Normal respiratory effort, no problems with respiration noted  Abdomen: Soft, gravid, appropriate for gestational age. Pain/Pressure: Present     Pelvic:  Cervical exam deferred        Extremities: Normal range of motion.  Edema: None  Mental Status: Normal mood and affect. Normal behavior. Normal judgment and thought content.   Urinalysis: Urine Protein: Negative Urine Glucose: Negative  Assessment and Plan:  Pregnancy: G2P1001 at 3550w4d  1. Supervision of normal pregnancy, antepartum, unspecified trimester Continue routine prenatal care.   Preterm labor symptoms and general obstetric precautions including but not limited to vaginal bleeding, contractions, leaking of fluid and fetal movement were reviewed in detail with the patient. Please refer to After Visit Summary for other counseling recommendations.  Return in 2 weeks (on  10/17/2015).  Reva Boresanya S Carole Doner, MD

## 2015-10-03 NOTE — Patient Instructions (Signed)

## 2015-10-03 NOTE — Progress Notes (Signed)
Patient reports she has started having lower abdominal /pelvic pain

## 2015-10-04 ENCOUNTER — Ambulatory Visit (HOSPITAL_COMMUNITY)
Admission: RE | Admit: 2015-10-04 | Discharge: 2015-10-04 | Disposition: A | Discharge status: Home / Self Care | Payer: Medicaid Other | Source: Ambulatory Visit | Attending: Certified Nurse Midwife | Admitting: Certified Nurse Midwife

## 2015-10-04 DIAGNOSIS — Z36 Encounter for antenatal screening of mother: Secondary | ICD-10-CM | POA: Insufficient documentation

## 2015-10-04 DIAGNOSIS — Z3483 Encounter for supervision of other normal pregnancy, third trimester: Secondary | ICD-10-CM | POA: Diagnosis present

## 2015-10-04 DIAGNOSIS — O26843 Uterine size-date discrepancy, third trimester: Secondary | ICD-10-CM | POA: Insufficient documentation

## 2015-10-04 DIAGNOSIS — Z3A34 34 weeks gestation of pregnancy: Secondary | ICD-10-CM | POA: Diagnosis not present

## 2015-10-21 ENCOUNTER — Ambulatory Visit (INDEPENDENT_AMBULATORY_CARE_PROVIDER_SITE_OTHER): Payer: Medicaid Other | Admitting: Obstetrics and Gynecology

## 2015-10-21 ENCOUNTER — Other Ambulatory Visit (HOSPITAL_COMMUNITY)
Admission: RE | Admit: 2015-10-21 | Discharge: 2015-10-21 | Disposition: A | Discharge status: Home / Self Care | Payer: Medicaid Other | Source: Ambulatory Visit | Attending: Obstetrics and Gynecology | Admitting: Obstetrics and Gynecology

## 2015-10-21 VITALS — BP 111/67 | HR 73 | Wt 140.0 lb

## 2015-10-21 DIAGNOSIS — Z113 Encounter for screening for infections with a predominantly sexual mode of transmission: Secondary | ICD-10-CM | POA: Insufficient documentation

## 2015-10-21 DIAGNOSIS — O99013 Anemia complicating pregnancy, third trimester: Secondary | ICD-10-CM | POA: Diagnosis not present

## 2015-10-21 DIAGNOSIS — O99019 Anemia complicating pregnancy, unspecified trimester: Secondary | ICD-10-CM

## 2015-10-21 DIAGNOSIS — Z3483 Encounter for supervision of other normal pregnancy, third trimester: Secondary | ICD-10-CM | POA: Diagnosis not present

## 2015-10-21 DIAGNOSIS — D649 Anemia, unspecified: Secondary | ICD-10-CM

## 2015-10-21 DIAGNOSIS — Z3480 Encounter for supervision of other normal pregnancy, unspecified trimester: Secondary | ICD-10-CM

## 2015-10-21 LAB — OB RESULTS CONSOLE GBS: STREP GROUP B AG: NEGATIVE

## 2015-10-21 LAB — OB RESULTS CONSOLE GC/CHLAMYDIA: Gonorrhea: NEGATIVE

## 2015-10-21 NOTE — Progress Notes (Signed)
   PRENATAL VISIT NOTE  Subjective:  Hannah Patel is a 25 y.o. G2P1001 at 5558w1d being seen today for ongoing prenatal care.  She is currently monitored for the following issues for this low-risk pregnancy and has Supervision of normal pregnancy, antepartum and Anemia, antepartum on her problem list.  Patient reports no complaints.  Contractions: Irregular. Vag. Bleeding: None.  Movement: Present. Denies leaking of fluid.   The following portions of the patient's history were reviewed and updated as appropriate: allergies, current medications, past family history, past medical history, past social history, past surgical history and problem list. Problem list updated.  Objective:   Vitals:   10/21/15 1106  BP: 111/67  Pulse: 73  Weight: 140 lb (63.5 kg)    Fetal Status: Fetal Heart Rate (bpm): 128 Fundal Height: 37 cm Movement: Present  Presentation: Vertex  General:  Alert, oriented and cooperative. Patient is in no acute distress.  Skin: Skin is warm and dry. No rash noted.   Cardiovascular: Normal heart rate noted  Respiratory: Normal respiratory effort, no problems with respiration noted  Abdomen: Soft, gravid, appropriate for gestational age. Pain/Pressure: Present     Pelvic:  Cervical exam performed Dilation: 2 Effacement (%): 50 Station: -3  Extremities: Normal range of motion.  Edema: None  Mental Status: Normal mood and affect. Normal behavior. Normal judgment and thought content.   Urinalysis: Urine Protein: Negative Urine Glucose: Negative  Assessment and Plan:  Pregnancy: G2P1001 at 1258w1d  1. Supervision of other normal pregnancy, antepartum Patient is doing well without complaints Cultures collected today Patient declined flu vaccine - Culture, beta strep (group b only) - GC/Chlamydia probe amp ([place])not at [facility]  2. Anemia, antepartum Continue iron supplements  Term labor symptoms and general obstetric precautions including but not limited to vaginal  bleeding, contractions, leaking of fluid and fetal movement were reviewed in detail with the patient. Please refer to After Visit Summary for other counseling recommendations.  Return in about 1 week (around 10/28/2015).  Catalina AntiguaPeggy Brizeyda Holtmeyer, MD

## 2015-10-22 LAB — GC/CHLAMYDIA PROBE AMP (~~LOC~~) NOT AT ARMC
Chlamydia: NEGATIVE
Neisseria Gonorrhea: NEGATIVE

## 2015-10-25 LAB — CULTURE, BETA STREP (GROUP B ONLY): Strep Gp B Culture: NEGATIVE

## 2015-10-29 ENCOUNTER — Ambulatory Visit (INDEPENDENT_AMBULATORY_CARE_PROVIDER_SITE_OTHER): Payer: Medicaid Other | Admitting: Certified Nurse Midwife

## 2015-10-29 VITALS — BP 105/67 | HR 75 | Wt 144.0 lb

## 2015-10-29 DIAGNOSIS — D649 Anemia, unspecified: Secondary | ICD-10-CM | POA: Diagnosis not present

## 2015-10-29 DIAGNOSIS — O99013 Anemia complicating pregnancy, third trimester: Secondary | ICD-10-CM

## 2015-10-29 DIAGNOSIS — O99019 Anemia complicating pregnancy, unspecified trimester: Secondary | ICD-10-CM

## 2015-10-29 DIAGNOSIS — Z3483 Encounter for supervision of other normal pregnancy, third trimester: Secondary | ICD-10-CM | POA: Diagnosis not present

## 2015-10-29 DIAGNOSIS — Z3480 Encounter for supervision of other normal pregnancy, unspecified trimester: Secondary | ICD-10-CM

## 2015-10-29 NOTE — Progress Notes (Signed)
Subjective:    Hannah Patel is a 25 y.o. female being seen today for her obstetrical visit. She is at 6261w2d gestation. Patient reports backache, fatigue, no bleeding, no leaking and occasional contractions. Fetal movement: normal.  Problem List Items Addressed This Visit      Other   Anemia, antepartum    Other Visit Diagnoses    Supervision of other normal pregnancy, antepartum    -  Primary   Encounter for supervision of other normal pregnancy in third trimester         Patient Active Problem List   Diagnosis Date Noted  . Supervision of normal pregnancy, antepartum 05/21/2015  . Anemia, antepartum 03/03/2014    Objective:    BP 105/67   Pulse 75   Wt 144 lb (65.3 kg)   LMP 02/19/2015 (LMP Unknown)   BMI 25.51 kg/m  FHT: 125 BPM  Uterine Size: 34 cm and size equals dates  Presentations: cephalic  Pelvic Exam:              Dilation: 1.5       Effacement: 25%             Station:  -3    Consistency: medium            Position: middle     Assessment:    Pregnancy @ 661w2d weeks   Chronic anemia  Plan:   Plans for delivery: Vaginal anticipated; labs reviewed; problem list updated Counseling: Consent signed. Infant feeding: plans to breastfeed. Cigarette smoking: never smoked. L&D discussion: symptoms of labor, discussed when to call, discussed what number to call, anesthetic/analgesic options reviewed and delivering clinician:  plans Certified Nurse-Midwife. Postpartum supports and preparation: circumcision discussed and contraception plans discussed.  Follow up in 1 Week.

## 2015-10-31 ENCOUNTER — Inpatient Hospital Stay (HOSPITAL_COMMUNITY)
Admission: AD | Admit: 2015-10-31 | Discharge: 2015-11-01 | DRG: 775 | Disposition: A | Discharge status: Home / Self Care | Payer: Medicaid Other | Source: Ambulatory Visit | Attending: Obstetrics & Gynecology | Admitting: Obstetrics & Gynecology

## 2015-10-31 ENCOUNTER — Encounter (HOSPITAL_COMMUNITY): Payer: Self-pay | Admitting: *Deleted

## 2015-10-31 DIAGNOSIS — O9902 Anemia complicating childbirth: Secondary | ICD-10-CM | POA: Diagnosis present

## 2015-10-31 DIAGNOSIS — Z3A38 38 weeks gestation of pregnancy: Secondary | ICD-10-CM

## 2015-10-31 DIAGNOSIS — D649 Anemia, unspecified: Secondary | ICD-10-CM | POA: Diagnosis present

## 2015-10-31 DIAGNOSIS — Z3480 Encounter for supervision of other normal pregnancy, unspecified trimester: Secondary | ICD-10-CM

## 2015-10-31 DIAGNOSIS — Z3483 Encounter for supervision of other normal pregnancy, third trimester: Secondary | ICD-10-CM | POA: Diagnosis present

## 2015-10-31 LAB — TYPE AND SCREEN
ABO/RH(D): A POS
Antibody Screen: NEGATIVE

## 2015-10-31 LAB — CBC
HCT: 32.4 % — ABNORMAL LOW (ref 36.0–46.0)
Hemoglobin: 10.7 g/dL — ABNORMAL LOW (ref 12.0–15.0)
MCH: 30.4 pg (ref 26.0–34.0)
MCHC: 33 g/dL (ref 30.0–36.0)
MCV: 92 fL (ref 78.0–100.0)
PLATELETS: 230 10*3/uL (ref 150–400)
RBC: 3.52 MIL/uL — AB (ref 3.87–5.11)
RDW: 12.9 % (ref 11.5–15.5)
WBC: 13.6 10*3/uL — AB (ref 4.0–10.5)

## 2015-10-31 LAB — ABO/RH: ABO/RH(D): A POS

## 2015-10-31 LAB — RPR: RPR: NONREACTIVE

## 2015-10-31 MED ORDER — INFLUENZA VAC SPLIT QUAD 0.5 ML IM SUSY: 0.5000 mL | PREFILLED_SYRINGE | INTRAMUSCULAR | Status: DC

## 2015-10-31 MED ORDER — OXYCODONE-ACETAMINOPHEN 5-325 MG PO TABS: 2.0000 | ORAL_TABLET | ORAL | Status: DC | PRN

## 2015-10-31 MED ORDER — BENZOCAINE-MENTHOL 20-0.5 % EX AERO
1.0000 "application " | INHALATION_SPRAY | CUTANEOUS | Status: DC | PRN
  Administered 2015-10-31: 1 via TOPICAL
  Filled 2015-10-31: qty 56

## 2015-10-31 MED ORDER — LACTATED RINGERS IV SOLN
500.0000 mL | INTRAVENOUS | Status: DC | PRN
  Administered 2015-10-31: 500 mL via INTRAVENOUS

## 2015-10-31 MED ORDER — SOD CITRATE-CITRIC ACID 500-334 MG/5ML PO SOLN: 30.0000 mL | ORAL | Status: DC | PRN

## 2015-10-31 MED ORDER — SODIUM CHLORIDE 0.9% FLUSH: 3.0000 mL | INTRAVENOUS | Status: DC | PRN

## 2015-10-31 MED ORDER — LIDOCAINE HCL (PF) 1 % IJ SOLN
30.0000 mL | INTRAMUSCULAR | Status: DC | PRN
  Administered 2015-10-31: 30 mL via SUBCUTANEOUS
  Filled 2015-10-31: qty 30

## 2015-10-31 MED ORDER — DIPHENHYDRAMINE HCL 25 MG PO CAPS: 25.0000 mg | ORAL_CAPSULE | Freq: Four times a day (QID) | ORAL | Status: DC | PRN

## 2015-10-31 MED ORDER — SODIUM CHLORIDE 0.9% FLUSH: 3.0000 mL | Freq: Two times a day (BID) | INTRAVENOUS | Status: DC

## 2015-10-31 MED ORDER — LACTATED RINGERS IV SOLN
INTRAVENOUS | Status: DC
  Administered 2015-10-31: 05:00:00 via INTRAVENOUS

## 2015-10-31 MED ORDER — FENTANYL CITRATE (PF) 100 MCG/2ML IJ SOLN
100.0000 ug | INTRAMUSCULAR | Status: DC | PRN
  Administered 2015-10-31 (×3): 100 ug via INTRAVENOUS
  Filled 2015-10-31 (×3): qty 2

## 2015-10-31 MED ORDER — WITCH HAZEL-GLYCERIN EX PADS: 1.0000 "application " | MEDICATED_PAD | CUTANEOUS | Status: DC | PRN

## 2015-10-31 MED ORDER — OXYCODONE-ACETAMINOPHEN 5-325 MG PO TABS: 1.0000 | ORAL_TABLET | ORAL | Status: DC | PRN

## 2015-10-31 MED ORDER — OXYTOCIN 40 UNITS IN LACTATED RINGERS INFUSION - SIMPLE MED
2.5000 [IU]/h | INTRAVENOUS | Status: DC
  Filled 2015-10-31: qty 1000

## 2015-10-31 MED ORDER — SODIUM CHLORIDE 0.9 % IV SOLN: 250.0000 mL | INTRAVENOUS | Status: DC | PRN

## 2015-10-31 MED ORDER — DIBUCAINE 1 % RE OINT: 1.0000 "application " | TOPICAL_OINTMENT | RECTAL | Status: DC | PRN

## 2015-10-31 MED ORDER — COCONUT OIL OIL: 1.0000 "application " | TOPICAL_OIL | Status: DC | PRN

## 2015-10-31 MED ORDER — PRENATAL MULTIVITAMIN CH
1.0000 | ORAL_TABLET | Freq: Every day | ORAL | Status: DC
  Administered 2015-10-31 – 2015-11-01 (×2): 1 via ORAL
  Filled 2015-10-31 (×2): qty 1

## 2015-10-31 MED ORDER — ONDANSETRON HCL 4 MG/2ML IJ SOLN
4.0000 mg | Freq: Four times a day (QID) | INTRAMUSCULAR | Status: DC | PRN
Start: 2015-10-31 — End: 2015-10-31

## 2015-10-31 MED ORDER — OXYCODONE HCL 5 MG PO TABS
5.0000 mg | ORAL_TABLET | Freq: Once | ORAL | Status: AC
  Administered 2015-10-31: 5 mg via ORAL
  Filled 2015-10-31: qty 1

## 2015-10-31 MED ORDER — ZOLPIDEM TARTRATE 5 MG PO TABS: 5.0000 mg | ORAL_TABLET | Freq: Every evening | ORAL | Status: DC | PRN

## 2015-10-31 MED ORDER — TETANUS-DIPHTH-ACELL PERTUSSIS 5-2.5-18.5 LF-MCG/0.5 IM SUSP: 0.5000 mL | Freq: Once | INTRAMUSCULAR | Status: DC

## 2015-10-31 MED ORDER — IBUPROFEN 600 MG PO TABS
600.0000 mg | ORAL_TABLET | Freq: Four times a day (QID) | ORAL | Status: DC
  Administered 2015-10-31 – 2015-11-01 (×6): 600 mg via ORAL
  Filled 2015-10-31 (×6): qty 1

## 2015-10-31 MED ORDER — ONDANSETRON HCL 4 MG/2ML IJ SOLN: 4.0000 mg | INTRAMUSCULAR | Status: DC | PRN

## 2015-10-31 MED ORDER — ACETAMINOPHEN 325 MG PO TABS
650.0000 mg | ORAL_TABLET | ORAL | Status: DC | PRN
  Administered 2015-10-31 – 2015-11-01 (×5): 650 mg via ORAL
  Filled 2015-10-31 (×5): qty 2

## 2015-10-31 MED ORDER — ACETAMINOPHEN 325 MG PO TABS: 650.0000 mg | ORAL_TABLET | ORAL | Status: DC | PRN

## 2015-10-31 MED ORDER — SENNOSIDES-DOCUSATE SODIUM 8.6-50 MG PO TABS
2.0000 | ORAL_TABLET | ORAL | Status: DC
  Administered 2015-11-01: 2 via ORAL
  Filled 2015-10-31: qty 2

## 2015-10-31 MED ORDER — OXYTOCIN BOLUS FROM INFUSION
500.0000 mL | Freq: Once | INTRAVENOUS | Status: AC
  Administered 2015-10-31: 500 mL via INTRAVENOUS

## 2015-10-31 MED ORDER — SIMETHICONE 80 MG PO CHEW: 80.0000 mg | CHEWABLE_TABLET | ORAL | Status: DC | PRN

## 2015-10-31 MED ORDER — ONDANSETRON HCL 4 MG PO TABS: 4.0000 mg | ORAL_TABLET | ORAL | Status: DC | PRN

## 2015-10-31 MED ORDER — OXYTOCIN 40 UNITS IN LACTATED RINGERS INFUSION - SIMPLE MED: 2.5000 [IU]/h | INTRAVENOUS | Status: DC | PRN

## 2015-10-31 NOTE — H&P (Signed)
Hannah Patel is a 25 y.o. female G2P1001 at 38.4 based on 16 week ultrasound, presenting for contractions . Denies LOF, bleeding, reports +FM. Denies headaches and visual disturbances. Denies falls during this pregnancy. Pregnancy followed by [facility] office, remarkable for Anemia. OB History    Gravida Para Term Preterm AB Living   2 1 1     1    SAB TAB Ectopic Multiple Live Births           1     Past Medical History:  Diagnosis Date  . Anemia    History reviewed. No pertinent surgical history. Family History: family history is not on file. Social History:  reports that she has never smoked. She has never used smokeless tobacco. She reports that she does not drink alcohol or use drugs.     Maternal Diabetes: No Genetic Screening: Normal Maternal Ultrasounds/Referrals: Normal Fetal Ultrasounds or other Referrals:  None Maternal Substance Abuse:  No Significant Maternal Medications:  None Significant Maternal Lab Results:  Lab values include: Group B Strep negative Other Comments:  None  ROS History Dilation: 5.5 Effacement (%): 90 Station: -1 Exam by:: B Mosca Blood pressure 140/80, pulse 67, temperature 97.7 F (36.5 C), temperature source Oral, resp. rate 20, height 5\' 3"  (1.6 m), weight 64.9 kg (143 lb), last menstrual period 02/19/2015. Exam Physical Exam  Constitutional: She is oriented to person, place, and time. She appears well-developed.  HENT:  Head: Normocephalic.  Neck: Normal range of motion.  Cardiovascular: Normal rate.   Respiratory: Effort normal.  GI:  EFM baseline 135, + accels, no decels, Cat I.  CTX q 3-5 min  Musculoskeletal: Normal range of motion.  Neurological: She is alert and oriented to person, place, and time.  Skin: Skin is warm and dry.  Psychiatric: She has a normal mood and affect. Her behavior is normal. Thought content normal.    Prenatal labs: ABO, Rh: A/Positive/-- (05/09 1611) Antibody: Negative (05/09 1611) Rubella: 5.47  (05/09 1611) RPR: Non Reactive (08/02 1044)  HBsAg: Negative (05/09 1611)  HIV: Non Reactive (08/02 1044)  GBS: Negative (10/09 0000)   Assessment/Plan: IUP @ 38.4 Latent labor GBS negative   [facility] Has received IV pain medicine Anticipate SVD  Clayton BiblesSamantha Weinhold 10/31/2015, 5:57 AM  CNM attestation:  I have seen and examined this patient; I agree with above documentation in the student's note.   Hannah Patel is a 25 y.o. G2P1001 here for SOL  PE: BP 136/74   Pulse 64   Temp 98.4 F (36.9 C) (Oral)   Resp 20   Ht 5\' 3"  (1.6 m)   Wt 64.9 kg (143 lb)   LMP 02/19/2015 (LMP Unknown)   BMI 25.33 kg/m  Gen: calm comfortable, NAD Resp: normal effort, no distress Abd: gravid  ROS, labs, PMH reviewed  [facility] Anticipate SVD  Cam HaiSHAW, Cortnee Steinmiller CNM 10/31/2015, 7:57 AM

## 2015-11-01 LAB — CBC
HEMATOCRIT: 27.3 % — AB (ref 36.0–46.0)
HEMOGLOBIN: 9 g/dL — AB (ref 12.0–15.0)
MCH: 30.4 pg (ref 26.0–34.0)
MCHC: 33 g/dL (ref 30.0–36.0)
MCV: 92.2 fL (ref 78.0–100.0)
Platelets: 214 10*3/uL (ref 150–400)
RBC: 2.96 MIL/uL — AB (ref 3.87–5.11)
RDW: 13.1 % (ref 11.5–15.5)
WBC: 14.2 10*3/uL — AB (ref 4.0–10.5)

## 2015-11-01 MED ORDER — MEDROXYPROGESTERONE ACETATE 150 MG/ML IM SUSP
150.0000 mg | Freq: Once | INTRAMUSCULAR | Status: AC
  Administered 2015-11-01: 150 mg via INTRAMUSCULAR
  Filled 2015-11-01: qty 1

## 2015-11-01 MED ORDER — OXYCODONE-ACETAMINOPHEN 5-325 MG PO TABS: 1.0000 | ORAL_TABLET | ORAL | 0 refills | Status: DC | PRN

## 2015-11-01 MED ORDER — COCONUT OIL OIL: 1.0000 "application " | TOPICAL_OIL | 99 refills | Status: AC | PRN

## 2015-11-01 MED ORDER — IBUPROFEN 600 MG PO TABS: 600.0000 mg | ORAL_TABLET | Freq: Four times a day (QID) | ORAL | 2 refills | Status: DC

## 2015-11-01 MED ORDER — SENNOSIDES-DOCUSATE SODIUM 8.6-50 MG PO TABS: 2.0000 | ORAL_TABLET | Freq: Two times a day (BID) | ORAL | 2 refills | Status: DC

## 2015-11-01 MED ORDER — MEDROXYPROGESTERONE ACETATE 150 MG/ML IM SUSP: 150.0000 mg | INTRAMUSCULAR | 4 refills | Status: DC

## 2015-11-01 NOTE — Progress Notes (Signed)
Post Partum Day #1 Subjective: no complaints, up ad lib, voiding and tolerating PO  Objective: Blood pressure (!) 99/57, pulse 63, temperature 97.8 F (36.6 C), temperature source Oral, resp. rate 16, height 5\' 3"  (1.6 m), weight 143 lb (64.9 kg), last menstrual period 02/19/2015, SpO2 99 %, unknown if currently breastfeeding.  Physical Exam:  General: alert, cooperative and no distress Lochia: appropriate Uterine Fundus: firm Incision: healing well, no significant drainage, no dehiscence, no significant erythema DVT Evaluation: No evidence of DVT seen on physical exam. No cords or calf tenderness. No significant calf/ankle edema.   Recent Labs  10/31/15 0515 11/01/15 0532  HGB 10.7* 9.0*  HCT 32.4* 27.3*    Assessment/Plan: Discharge home, Breastfeeding, Circumcision prior to discharge and Contraception Depo injections   LOS: 1 day   Roe CoombsRachelle A Adah Stoneberg, CNM 11/01/2015, 8:46 AM

## 2015-11-01 NOTE — Discharge Instructions (Signed)
Breast Pumping Tips °If you are breastfeeding, there may be times when you cannot feed your baby directly. Returning to work or going on a trip are examples. Pumping allows you to store breast milk and feed it to your baby later.  °You may not get much milk when you first start to pump. Your breasts should start to make more after a few days. If you pump at the times you usually feed your baby, you may be able to keep making enough milk to feed your baby without also using formula. The more often you pump, the more milk your body will make. °WHEN SHOULD I PUMP?  °· You can start to pump soon after you have your baby. Ask your doctor what is right for you and your baby. °· If you are going back to work, start pumping a few weeks before. This gives you time to learn how to pump and to store a supply of milk. °· When you are with your baby, feed your baby when he or she is hungry. Pump after each feeding. °· When you are away from your baby for many hours, pump for about 15 minutes every 2-3 hours. Pump both breasts at the same time if you can. °· If your baby has a formula feeding, make sure to pump close to the same time. °· If you drink any alcohol, wait 2 hours before pumping. °HOW DO I GET READY TO PUMP? °Your let-down reflex is your body's natural reaction that makes your breast milk flow. It is easier to make your breast milk flow when you are relaxed. Try these things to help you relax: °· Smell one of your baby's blankets or an item of clothing. °· Look at a picture or video of your baby. °· Sit in a quiet, private space. °· Massage the breast you plan to pump. °· Place soothing warmth on the breast. °· Play relaxing music. °WHAT ARE SOME BREAST PUMPING TIPS? °· Wash your hands before you pump. You do not need to wash your nipples or breasts. °· There are three ways to pump. You can: °¨ Use your hand to massage and squeeze your breast. °¨ Use a handheld manual pump. °¨ Use an electric pump. °· Make sure the  suction cup on the breast pump is the right size. Place the suction cup directly over the nipple. It can be painful or hurt your nipple if it is the wrong size or placed wrong. °· Put a small amount of purified or modified lanolin on your nipple and areola if you are sore. °· If you are using an electric pump, change the speed and suction power to be more comfortable. °· You may need a different type of pump if pumping hurts or you do not get a lot of milk. Your doctor can help you pick what type of pump to use. °· Keep a full water bottle near you always. Drinking lots of fluid helps you make more milk. °· You can store your milk to use later. Pumped breast milk can be stored in a sealable, sterile container or plastic bag. Always put the date you pumped it on the container. °¨ Milk can stay out at room temperature for up to 8 hours. °¨ You can store your milk in the refrigerator for up to 8 days. °¨ You can store your milk in the freezer for 3 months. Thaw frozen milk using warm water. Do not put it in the microwave. °· Do not smoke.   Ask your doctor for help. °WHEN SHOULD I CALL MY DOCTOR? °· You have a hard time pumping. °· You are worried you do not make enough milk. °· You have nipple pain, soreness, or redness. °· You want to take birth control pills. °  °This information is not intended to replace advice given to you by your health care provider. Make sure you discuss any questions you have with your health care provider. °  °Document Released: 06/17/2007 Document Revised: 01/03/2013 Document Reviewed: 10/21/2012 °Elsevier Interactive Patient Education ©2016 [employer] Inc[employer] ° °Kegel Exercises °The goal of Kegel exercises is to isolate and exercise your pelvic floor muscles. These muscles act as a hammock that supports the rectum, vagina, small intestine, and uterus. As the muscles weaken, the hammock sags and these organs are displaced from their normal positions. Kegel exercises can strengthen your pelvic  floor muscles and help you to improve bladder and bowel control, improve sexual response, and help reduce many problems and some discomfort during pregnancy. Kegel exercises can be done anywhere and at any time. °HOW TO PERFORM KEGEL EXERCISES °1. Locate your pelvic floor muscles. To do this, squeeze (contract) the muscles that you use when you try to stop the flow of urine. You will feel a tightness in the vaginal area (women) and a tight lift in the rectal area (men and women). °2. When you begin, contract your pelvic muscles tight for 2-5 seconds, then relax them for 2-5 seconds. This is one set. Do 4-5 sets with a short pause in between. °3. Contract your pelvic muscles for 8-10 seconds, then relax them for 8-10 seconds. Do 4-5 sets. If you cannot contract your pelvic muscles for 8-10 seconds, try 5-7 seconds and work your way up to 8-10 seconds. Your goal is 4-5 sets of 10 contractions each day. °Keep your stomach, buttocks, and legs relaxed during the exercises. Perform sets of both short and long contractions. Vary your positions. Perform these contractions 3-4 times per day. Perform sets while you are:   °· Lying in bed in the morning. °· Standing at lunch. °· Sitting in the late afternoon. °· Lying in bed at night.  °You should do 40-50 contractions per day. Do not perform more Kegel exercises per day than recommended. Overexercising can cause muscle fatigue. Continue these exercises for for at least 15-20 weeks or as directed by your caregiver. °  °This information is not intended to replace advice given to you by your health care provider. Make sure you discuss any questions you have with your health care provider. °  °Document Released: 12/16/2011 Document Revised: 01/19/2014 Document Reviewed: 12/16/2011 °Elsevier Interactive Patient Education ©2016 Elsevier Inc. ° °

## 2015-11-01 NOTE — Discharge Summary (Signed)
Obstetric Discharge Summary Reason for Admission: onset of labor @38 .4 weeks Prenatal Procedures: ultrasound Intrapartum Procedures: spontaneous vaginal delivery Postpartum Procedures: none Complications-Operative and Postpartum: labial laceration Hemoglobin  Date Value Ref Range Status  11/01/2015 9.0 (L) 12.0 - 15.0 g/dL Final   HCT  Date Value Ref Range Status  11/01/2015 27.3 (L) 36.0 - 46.0 % Final   Hematocrit  Date Value Ref Range Status  08/14/2015 31.7 (L) 34.0 - 46.6 % Final    Physical Exam:  General: alert, cooperative and no distress Lochia: appropriate Uterine Fundus: firm Incision: healing well, no significant drainage, no dehiscence, no significant erythema DVT Evaluation: No evidence of DVT seen on physical exam. No cords or calf tenderness. No significant calf/ankle edema.  Discharge Diagnoses: Term Pregnancy-delivered  Discharge Information: Date: 11/01/2015 Activity: pelvic rest Diet: routine Medications: PNV, Ibuprofen, Colace, Iron and Percocet Condition: stable Instructions: refer to practice specific booklet Discharge to: home Follow-up Information    Hannah Patel, CNM Follow up in 2 week(s).   Specialty:  Certified Nurse Midwife Contact information: [address] [place] [address] [phone]           Newborn Data: Live born female  Birth Weight: 6 lb 4.9 oz (2860 g) APGAR: 8, 9  Home with mother.  Hannah Patel, CNM 11/01/2015, 8:51 AM

## 2015-11-29 ENCOUNTER — Encounter: Payer: Self-pay | Admitting: Obstetrics

## 2015-11-29 ENCOUNTER — Ambulatory Visit (INDEPENDENT_AMBULATORY_CARE_PROVIDER_SITE_OTHER): Payer: Medicaid Other | Admitting: Obstetrics

## 2015-11-29 DIAGNOSIS — Z3009 Encounter for other general counseling and advice on contraception: Secondary | ICD-10-CM

## 2015-11-29 NOTE — Progress Notes (Signed)
Subjective:     Hannah Patel is a 25 y.o. female who presents for a postpartum visit. She is 4 weeks postpartum following a spontaneous vaginal delivery. I have fully reviewed the prenatal and intrapartum course. The delivery was at 38 gestational weeks. Outcome: spontaneous vaginal delivery. Anesthesia: local. Postpartum course has been normal. Baby's course has been normal. Baby is feeding by bottle - Similac Advance. Bleeding no bleeding. Bowel function is normal. Bladder function is normal. Patient is not sexually active. Contraception method is Depo-Provera injections. Postpartum depression screening: negative.  Tobacco, alcohol and substance abuse history reviewed.  Adult immunizations reviewed including TDAP, rubella and varicella.  The following portions of the patient's history were reviewed and updated as appropriate: allergies, current medications, past family history, past medical history, past social history, past surgical history and problem list.  Review of Systems A comprehensive review of systems was negative.   Objective:    BP 114/75   Pulse 82   Temp 98.7 F (37.1 C) (Oral)   Ht 5\' 3"  (1.6 m)   Wt 130 lb 1.6 oz (59 kg)   Breastfeeding? No   BMI 23.05 kg/m   General:  alert and no distress   Breasts:  inspection negative, no nipple discharge or bleeding, no masses or nodularity palpable  Lungs: clear to auscultation bilaterally  Heart:  regular rate and rhythm, S1, S2 normal, no murmur, click, rub or gallop  Abdomen: normal findings: no masses palpable and soft, non-tender   Vulva:  normal  Vagina: normal vagina  Cervix:  no cervical motion tenderness  Corpus: normal size, contour, position, consistency, mobility, non-tender  Adnexa:  no mass, fullness, tenderness  Rectal Exam: Not performed.          50% of 15 min visit spent on counseling and coordination of care.    Assessment:     Normal postpartum exam. Pap smear not done at today's visit.    Contraceptive Counseling and Advice.  Wants Depo Provera.  Plan:    1. Contraception: Depo-Provera injections 2. Depo Provera Rx 3. Follow up in: 4 weeks or as needed.   Healthy lifestyle practices reviewed

## 2015-11-30 ENCOUNTER — Encounter: Payer: Self-pay | Admitting: Obstetrics

## 2015-12-02 ENCOUNTER — Ambulatory Visit: Payer: Medicaid Other | Admitting: Obstetrics

## 2015-12-02 ENCOUNTER — Encounter: Payer: Self-pay | Admitting: Obstetrics

## 2015-12-09 ENCOUNTER — Ambulatory Visit: Payer: Medicaid Other | Admitting: Obstetrics

## 2015-12-11 ENCOUNTER — Ambulatory Visit: Payer: Medicaid Other | Admitting: Obstetrics

## 2015-12-13 DIAGNOSIS — S39012A Strain of muscle, fascia and tendon of lower back, initial encounter: Secondary | ICD-10-CM | POA: Diagnosis not present

## 2015-12-13 DIAGNOSIS — Y92410 Unspecified street and highway as the place of occurrence of the external cause: Secondary | ICD-10-CM | POA: Insufficient documentation

## 2015-12-13 DIAGNOSIS — Y939 Activity, unspecified: Secondary | ICD-10-CM | POA: Insufficient documentation

## 2015-12-13 DIAGNOSIS — Z79899 Other long term (current) drug therapy: Secondary | ICD-10-CM | POA: Insufficient documentation

## 2015-12-13 DIAGNOSIS — Y999 Unspecified external cause status: Secondary | ICD-10-CM | POA: Diagnosis not present

## 2015-12-13 DIAGNOSIS — S3992XA Unspecified injury of lower back, initial encounter: Secondary | ICD-10-CM | POA: Diagnosis present

## 2015-12-14 ENCOUNTER — Encounter (HOSPITAL_COMMUNITY): Payer: Self-pay | Admitting: *Deleted

## 2015-12-14 ENCOUNTER — Emergency Department (HOSPITAL_COMMUNITY)
Admission: EM | Admit: 2015-12-14 | Discharge: 2015-12-14 | Disposition: A | Discharge status: Home / Self Care | Payer: Medicaid Other | Attending: Emergency Medicine | Admitting: Emergency Medicine

## 2015-12-14 DIAGNOSIS — S39012A Strain of muscle, fascia and tendon of lower back, initial encounter: Secondary | ICD-10-CM

## 2015-12-14 MED ORDER — CYCLOBENZAPRINE HCL 10 MG PO TABS
5.0000 mg | ORAL_TABLET | Freq: Once | ORAL | Status: AC
  Administered 2015-12-14: 5 mg via ORAL

## 2015-12-14 MED ORDER — IBUPROFEN 600 MG PO TABS: 600.0000 mg | ORAL_TABLET | Freq: Three times a day (TID) | ORAL | 2 refills | Status: AC | PRN

## 2015-12-14 MED ORDER — CYCLOBENZAPRINE HCL 5 MG PO TABS: 5.0000 mg | ORAL_TABLET | Freq: Three times a day (TID) | ORAL | 0 refills | Status: DC | PRN

## 2015-12-14 MED ORDER — IBUPROFEN 200 MG PO TABS
600.0000 mg | ORAL_TABLET | Freq: Once | ORAL | Status: AC
  Administered 2015-12-14: 600 mg via ORAL

## 2015-12-14 NOTE — Discharge Instructions (Signed)
Take the muscle relaxer and antiinflammatory as needed   apply Heat for 20-30 minutes at a time
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
## 2015-12-14 NOTE — ED Notes (Signed)
Bed: WA02 Expected date:  Expected time:  Means of arrival:  Comments: 
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
## 2015-12-27 ENCOUNTER — Ambulatory Visit: Payer: Medicaid Other | Admitting: Obstetrics

## 2016-01-16 ENCOUNTER — Ambulatory Visit: Payer: Medicaid Other | Admitting: Obstetrics

## 2016-01-28 ENCOUNTER — Ambulatory Visit: Payer: Medicaid Other | Admitting: Obstetrics

## 2016-02-11 ENCOUNTER — Other Ambulatory Visit (HOSPITAL_COMMUNITY)
Admission: RE | Admit: 2016-02-11 | Discharge: 2016-02-11 | Disposition: A | Discharge status: Home / Self Care | Payer: Medicaid Other | Source: Ambulatory Visit | Attending: Obstetrics | Admitting: Obstetrics

## 2016-02-11 ENCOUNTER — Ambulatory Visit (INDEPENDENT_AMBULATORY_CARE_PROVIDER_SITE_OTHER): Payer: Medicaid Other | Admitting: Obstetrics

## 2016-02-11 ENCOUNTER — Encounter: Payer: Self-pay | Admitting: Obstetrics

## 2016-02-11 VITALS — BP 128/78 | HR 77 | Wt 125.4 lb

## 2016-02-11 DIAGNOSIS — Z3042 Encounter for surveillance of injectable contraceptive: Secondary | ICD-10-CM | POA: Diagnosis not present

## 2016-02-11 DIAGNOSIS — N926 Irregular menstruation, unspecified: Secondary | ICD-10-CM | POA: Diagnosis not present

## 2016-02-11 DIAGNOSIS — Z3202 Encounter for pregnancy test, result negative: Secondary | ICD-10-CM

## 2016-02-11 DIAGNOSIS — N760 Acute vaginitis: Secondary | ICD-10-CM | POA: Diagnosis not present

## 2016-02-11 DIAGNOSIS — B9689 Other specified bacterial agents as the cause of diseases classified elsewhere: Secondary | ICD-10-CM

## 2016-02-11 DIAGNOSIS — N939 Abnormal uterine and vaginal bleeding, unspecified: Secondary | ICD-10-CM

## 2016-02-11 LAB — POCT URINE PREGNANCY: PREG TEST UR: NEGATIVE

## 2016-02-11 MED ORDER — MEDROXYPROGESTERONE ACETATE 150 MG/ML IM SUSP
150.0000 mg | Freq: Once | INTRAMUSCULAR | Status: AC
Start: 2016-02-11 — End: 2016-02-11
  Administered 2016-02-11: 150 mg via INTRAMUSCULAR

## 2016-02-11 MED ORDER — MEDROXYPROGESTERONE ACETATE 10 MG PO TABS: 10.0000 mg | ORAL_TABLET | Freq: Every day | ORAL | 0 refills | Status: DC

## 2016-02-11 MED ORDER — DOXYCYCLINE HYCLATE 100 MG PO CAPS: 100.0000 mg | ORAL_CAPSULE | Freq: Two times a day (BID) | ORAL | 0 refills | Status: DC

## 2016-02-11 MED ORDER — METRONIDAZOLE 500 MG PO TABS: 500.0000 mg | ORAL_TABLET | Freq: Two times a day (BID) | ORAL | 0 refills | Status: DC

## 2016-02-11 NOTE — Progress Notes (Signed)
Subjective:    Hannah Patel is a 26 y.o. female who presents for De[po Provera injection. This is her 2nd injection, and she has had BTB since starting Depo. The patient is sexually active. Pertinent past medical history: none.  The information documented in the HPI was reviewed and verified.  Menstrual History: OB History    Gravida Para Term Preterm AB Living   2 2 2     2    SAB TAB Ectopic Multiple Live Births         0 2       No LMP recorded.   Patient Active Problem List   Diagnosis Date Noted  . Labor and delivery, indication for care 10/31/2015  . Supervision of normal pregnancy, antepartum 05/21/2015  . Anemia, antepartum 03/03/2014   Past Medical History:  Diagnosis Date  . Anemia     History reviewed. No pertinent surgical history.   Current Outpatient Prescriptions:  .  medroxyPROGESTERone (DEPO-PROVERA) 150 MG/ML injection, Inject 1 mL (150 mg total) into the muscle every 3 (three) months., Disp: 1 mL, Rfl: 4 .  coconut oil OIL, Apply 1 application topically as needed. (Patient not taking: Reported on 11/29/2015), Disp: 500 mL, Rfl: PRN .  cyclobenzaprine (FLEXERIL) 5 MG tablet, Take 1 tablet (5 mg total) by mouth 3 (three) times daily as needed for muscle spasms. (Patient not taking: Reported on 02/11/2016), Disp: 20 tablet, Rfl: 0 .  doxycycline (VIBRAMYCIN) 100 MG capsule, Take 1 capsule (100 mg total) by mouth 2 (two) times daily., Disp: 14 capsule, Rfl: 0 .  ibuprofen (ADVIL,MOTRIN) 600 MG tablet, Take 1 tablet (600 mg total) by mouth every 8 (eight) hours as needed. (Patient not taking: Reported on 02/11/2016), Disp: 30 tablet, Rfl: 2 .  Iron Polysacch Cmplx-B12-FA 150-0.025-1 MG CAPS, Take 1 tablet by mouth daily. (Patient not taking: Reported on 11/29/2015), Disp: 30 each, Rfl: 4 .  medroxyPROGESTERone (PROVERA) 10 MG tablet, Take 1 tablet (10 mg total) by mouth daily., Disp: 30 tablet, Rfl: 0 .  metroNIDAZOLE (FLAGYL) 500 MG tablet, Take 1 tablet (500 mg total)  by mouth 2 (two) times daily., Disp: 14 tablet, Rfl: 0 .  oxyCODONE-acetaminophen (ROXICET) 5-325 MG tablet, Take 1-2 tablets by mouth every 4 (four) hours as needed for severe pain. (Patient not taking: Reported on 11/29/2015), Disp: 30 tablet, Rfl: 0 .  Prenat-FePoly-Metf-FA-DHA-DSS (VITAFOL FE+) 90-1-200 & 50 MG CPPK, Take 2 tablets by mouth daily. (Patient not taking: Reported on 02/11/2016), Disp: 60 each, Rfl: 12 .  senna-docusate (SENOKOT-S) 8.6-50 MG tablet, Take 2 tablets by mouth 2 (two) times daily. (Patient not taking: Reported on 11/29/2015), Disp: 90 tablet, Rfl: 2 No Known Allergies  Social History  Substance Use Topics  . Smoking status: Never Smoker  . Smokeless tobacco: Never Used  . Alcohol use No    History reviewed. No pertinent family history.     Review of Systems Constitutional: negative for weight loss Genitourinary:negative for abnormal menstrual periods and vaginal discharge   Objective:   BP 128/78   Pulse 77   Wt 125 lb 6.4 oz (56.9 kg)   Breastfeeding? No   BMI 22.21 kg/m    General:   alert  Skin:   no rash or abnormalities  Lungs:   clear to auscultation bilaterally  Heart:   regular rate and rhythm, S1, S2 normal, no murmur, click, rub or gallop  Breasts:   normal without suspicious masses, skin or nipple changes or axillary nodes  Abdomen:  normal findings: no organomegaly, soft, non-tender and no hernia  Pelvis:  External genitalia: normal general appearance Urinary system: urethral meatus normal and bladder without fullness, nontender Vaginal: normal without tenderness, induration or masses.  Menstrual blood in vault, malodorous Cervix: normal appearance Adnexa: normal bimanual exam Uterus: anteverted and non-tender, normal size   Lab Review Urine pregnancy test Labs reviewed yes Radiologic studies reviewed no  50% of 15 min visit spent on counseling and coordination of care.    Assessment:    26 y.o., continuing Depo-Provera  injections, no contraindications.    AUB - probable anatomic uneven endometrial lining  BV  Plan:    Doxy / Flagyl Rx for BV  Provera Rx x 30 days to stabilize endometrium  All questions answered. Chlamydia specimen. Contraception: Depo-Provera injections. Diagnosis explained in detail, including differential. Discussed healthy lifestyle modifications. Follow up in 3 months. GC specimen. Wet prep.  Meds ordered this encounter  Medications  . medroxyPROGESTERone (PROVERA) 10 MG tablet    Sig: Take 1 tablet (10 mg total) by mouth daily.    Dispense:  30 tablet    Refill:  0  . doxycycline (VIBRAMYCIN) 100 MG capsule    Sig: Take 1 capsule (100 mg total) by mouth 2 (two) times daily.    Dispense:  14 capsule    Refill:  0  . metroNIDAZOLE (FLAGYL) 500 MG tablet    Sig: Take 1 tablet (500 mg total) by mouth 2 (two) times daily.    Dispense:  14 tablet    Refill:  0   Orders Placed This Encounter  Procedures  . POCT urine pregnancy

## 2016-02-11 NOTE — Addendum Note (Signed)
Addended by: Elby BeckPAUL, JANE F on: 02/11/2016 02:43 PM   Modules accepted: Orders

## 2016-02-11 NOTE — Progress Notes (Signed)
Pt c/o VB since 1st Depo Nov 01, 2015. Pt 2wks late for Depo. UPT today neg. Last IC was 2-3 wks ago. Depo given w/o difficulty L Deltoid.

## 2016-02-12 ENCOUNTER — Other Ambulatory Visit: Payer: Self-pay | Admitting: Obstetrics

## 2016-02-12 LAB — CBC
HEMOGLOBIN: 11 g/dL — AB (ref 11.1–15.9)
Hematocrit: 36.4 % (ref 34.0–46.6)
MCH: 27.9 pg (ref 26.6–33.0)
MCHC: 30.2 g/dL — ABNORMAL LOW (ref 31.5–35.7)
MCV: 92 fL (ref 79–97)
PLATELETS: 321 10*3/uL (ref 150–379)
RBC: 3.94 x10E6/uL (ref 3.77–5.28)
RDW: 12.6 % (ref 12.3–15.4)
WBC: 4.8 10*3/uL (ref 3.4–10.8)

## 2016-02-12 LAB — CERVICOVAGINAL ANCILLARY ONLY
BACTERIAL VAGINITIS: POSITIVE — AB
CANDIDA VAGINITIS: NEGATIVE
CHLAMYDIA, DNA PROBE: NEGATIVE
Neisseria Gonorrhea: NEGATIVE
TRICH (WINDOWPATH): NEGATIVE

## 2016-04-29 ENCOUNTER — Telehealth: Payer: Self-pay

## 2016-04-29 NOTE — Telephone Encounter (Signed)
TC from pt states she was unable to pick up Provera 10 mg tabs in Jan d/t insurance issues. Pt corrected insurance issue but pharmacy requests an updated rx. Spoke with " Alex" at [facility] to update Provera rx. Pt is aware and has no further questions.

## 2016-05-05 ENCOUNTER — Ambulatory Visit (INDEPENDENT_AMBULATORY_CARE_PROVIDER_SITE_OTHER): Payer: Medicaid Other

## 2016-05-05 DIAGNOSIS — Z3042 Encounter for surveillance of injectable contraceptive: Secondary | ICD-10-CM

## 2016-05-05 DIAGNOSIS — N939 Abnormal uterine and vaginal bleeding, unspecified: Secondary | ICD-10-CM

## 2016-05-05 DIAGNOSIS — N760 Acute vaginitis: Secondary | ICD-10-CM

## 2016-05-05 DIAGNOSIS — B9689 Other specified bacterial agents as the cause of diseases classified elsewhere: Secondary | ICD-10-CM

## 2016-05-05 MED ORDER — MEDROXYPROGESTERONE ACETATE 150 MG/ML IM SUSP
150.0000 mg | INTRAMUSCULAR | Status: DC
  Administered 2016-05-05 – 2017-08-02 (×2): 150 mg via INTRAMUSCULAR

## 2016-05-05 MED ORDER — DOXYCYCLINE HYCLATE 100 MG PO CAPS: 100.0000 mg | ORAL_CAPSULE | Freq: Two times a day (BID) | ORAL | 0 refills | Status: DC

## 2016-05-05 MED ORDER — METRONIDAZOLE 500 MG PO TABS: 500.0000 mg | ORAL_TABLET | Freq: Two times a day (BID) | ORAL | 0 refills | Status: DC

## 2016-05-05 NOTE — Progress Notes (Signed)
Patient is in the office for Depo injection, administered and pt tolerated well .Marland Kitchen Administrations This Visit    medroxyPROGESTERone (DEPO-PROVERA) injection 150 mg    Admin Date 05/05/2016 Action Given Dose 150 mg Route Intramuscular Administered By Katrina Stack, RN

## 2016-05-31 ENCOUNTER — Other Ambulatory Visit: Payer: Self-pay | Admitting: Obstetrics

## 2016-05-31 DIAGNOSIS — N939 Abnormal uterine and vaginal bleeding, unspecified: Secondary | ICD-10-CM

## 2016-05-31 DIAGNOSIS — N926 Irregular menstruation, unspecified: Secondary | ICD-10-CM

## 2016-05-31 DIAGNOSIS — Z3042 Encounter for surveillance of injectable contraceptive: Secondary | ICD-10-CM

## 2016-08-04 ENCOUNTER — Ambulatory Visit (INDEPENDENT_AMBULATORY_CARE_PROVIDER_SITE_OTHER): Payer: Medicaid Other

## 2016-08-04 DIAGNOSIS — Z3042 Encounter for surveillance of injectable contraceptive: Secondary | ICD-10-CM | POA: Diagnosis not present

## 2016-08-04 MED ORDER — MEDROXYPROGESTERONE ACETATE 150 MG/ML IM SUSP
150.0000 mg | Freq: Once | INTRAMUSCULAR | Status: AC
  Administered 2016-08-04: 150 mg via INTRAMUSCULAR

## 2016-08-26 ENCOUNTER — Other Ambulatory Visit (HOSPITAL_COMMUNITY)
Admission: RE | Admit: 2016-08-26 | Discharge: 2016-08-26 | Disposition: A | Discharge status: Home / Self Care | Payer: Medicaid Other | Source: Ambulatory Visit | Attending: Obstetrics | Admitting: Obstetrics

## 2016-08-26 ENCOUNTER — Ambulatory Visit (INDEPENDENT_AMBULATORY_CARE_PROVIDER_SITE_OTHER): Payer: Medicaid Other | Admitting: Obstetrics

## 2016-08-26 ENCOUNTER — Encounter: Payer: Self-pay | Admitting: Obstetrics

## 2016-08-26 VITALS — BP 119/76 | HR 68 | Ht 63.0 in | Wt 125.0 lb

## 2016-08-26 DIAGNOSIS — Z0000 Encounter for general adult medical examination without abnormal findings: Secondary | ICD-10-CM | POA: Diagnosis not present

## 2016-08-26 DIAGNOSIS — Z01419 Encounter for gynecological examination (general) (routine) without abnormal findings: Secondary | ICD-10-CM | POA: Diagnosis present

## 2016-08-26 DIAGNOSIS — N898 Other specified noninflammatory disorders of vagina: Secondary | ICD-10-CM

## 2016-08-26 DIAGNOSIS — Z3042 Encounter for surveillance of injectable contraceptive: Secondary | ICD-10-CM

## 2016-08-26 DIAGNOSIS — D509 Iron deficiency anemia, unspecified: Secondary | ICD-10-CM

## 2016-08-26 MED ORDER — CITRANATAL BLOOM 90-1 MG PO TABS: 1.0000 | ORAL_TABLET | Freq: Every day | ORAL | 11 refills | Status: DC

## 2016-08-26 NOTE — Progress Notes (Signed)
Patient is the office for annual, last pap 05-21-15.

## 2016-08-27 ENCOUNTER — Other Ambulatory Visit: Payer: Self-pay | Admitting: Obstetrics

## 2016-08-27 DIAGNOSIS — N760 Acute vaginitis: Principal | ICD-10-CM

## 2016-08-27 DIAGNOSIS — B9689 Other specified bacterial agents as the cause of diseases classified elsewhere: Secondary | ICD-10-CM

## 2016-08-27 LAB — CYTOLOGY - PAP
DIAGNOSIS: NEGATIVE
HPV: NOT DETECTED

## 2016-08-27 LAB — CERVICOVAGINAL ANCILLARY ONLY
BACTERIAL VAGINITIS: POSITIVE — AB
Candida vaginitis: NEGATIVE
Chlamydia: NEGATIVE
Neisseria Gonorrhea: NEGATIVE
Trichomonas: NEGATIVE

## 2016-08-27 MED ORDER — METRONIDAZOLE 500 MG PO TABS: 500.0000 mg | ORAL_TABLET | Freq: Two times a day (BID) | ORAL | 2 refills | Status: DC

## 2016-08-27 NOTE — Progress Notes (Signed)
Subjective:        Hannah Patel is a 26 y.o. female here for a routine exam.  Current complaints: None.    Personal health questionnaire:  Is patient Ashkenazi Jewish, have a family history of breast and/or ovarian cancer: no Is there a family history of uterine cancer diagnosed at age < 62, gastrointestinal cancer, urinary tract cancer, family member who is a Personnel officer syndrome-associated carrier: no Is the patient overweight and hypertensive, family history of diabetes, personal history of gestational diabetes, preeclampsia or PCOS: no Is patient over 78, have PCOS,  family history of premature CHD under age 29, diabetes, smoke, have hypertension or peripheral artery disease:  no At any time, has a partner hit, kicked or otherwise hurt or frightened you?: no Over the past 2 weeks, have you felt down, depressed or hopeless?: no Over the past 2 weeks, have you felt little interest or pleasure in doing things?:no   Gynecologic History No LMP recorded. Patient has had an injection. Contraception: Depo-Provera injections Last Pap: 2017. Results were: normal Last mammogram: n/a. Results were: n/a  Obstetric History OB History  Gravida Para Term Preterm AB Living  2 2 2     2   SAB TAB Ectopic Multiple Live Births        0 2    # Outcome Date GA Lbr Len/2nd Weight Sex Delivery Anes PTL Lv  2 Term 10/31/15 [redacted]w[redacted]d 11:30 / 00:11 6 lb 4.9 oz (2.86 kg) F Vag-Spont Local  LIV     Birth Comments: none  1 Term 09/23/14 [redacted]w[redacted]d  6 lb 8 oz (2.948 kg) F Vag-Spont None N LIV      Past Medical History:  Diagnosis Date  . Anemia     History reviewed. No pertinent surgical history.   Current Outpatient Prescriptions:  .  medroxyPROGESTERone (DEPO-PROVERA) 150 MG/ML injection, Inject 1 mL (150 mg total) into the muscle every 3 (three) months., Disp: 1 mL, Rfl: 4 .  coconut oil OIL, Apply 1 application topically as needed. (Patient not taking: Reported on 11/29/2015), Disp: 500 mL, Rfl: PRN .   cyclobenzaprine (FLEXERIL) 5 MG tablet, Take 1 tablet (5 mg total) by mouth 3 (three) times daily as needed for muscle spasms. (Patient not taking: Reported on 02/11/2016), Disp: 20 tablet, Rfl: 0 .  doxycycline (VIBRAMYCIN) 100 MG capsule, Take 1 capsule (100 mg total) by mouth 2 (two) times daily. (Patient not taking: Reported on 08/26/2016), Disp: 14 capsule, Rfl: 0 .  ibuprofen (ADVIL,MOTRIN) 600 MG tablet, Take 1 tablet (600 mg total) by mouth every 8 (eight) hours as needed. (Patient not taking: Reported on 02/11/2016), Disp: 30 tablet, Rfl: 2 .  Iron Polysacch Cmplx-B12-FA 150-0.025-1 MG CAPS, Take 1 tablet by mouth daily. (Patient not taking: Reported on 11/29/2015), Disp: 30 each, Rfl: 4 .  medroxyPROGESTERone (PROVERA) 10 MG tablet, Take 1 tablet (10 mg total) by mouth daily. (Patient not taking: Reported on 08/26/2016), Disp: 30 tablet, Rfl: 0 .  metroNIDAZOLE (FLAGYL) 500 MG tablet, Take 1 tablet (500 mg total) by mouth 2 (two) times daily. (Patient not taking: Reported on 08/26/2016), Disp: 14 tablet, Rfl: 0 .  oxyCODONE-acetaminophen (ROXICET) 5-325 MG tablet, Take 1-2 tablets by mouth every 4 (four) hours as needed for severe pain. (Patient not taking: Reported on 11/29/2015), Disp: 30 tablet, Rfl: 0 .  Prenat-FePoly-Metf-FA-DHA-DSS (VITAFOL FE+) 90-1-200 & 50 MG CPPK, Take 2 tablets by mouth daily. (Patient not taking: Reported on 02/11/2016), Disp: 60 each, Rfl: 12 .  Prenatal-DSS-FeCb-FeGl-FA (CITRANATAL BLOOM) 90-1 MG TABS, Take 1 tablet by mouth daily before breakfast., Disp: 30 tablet, Rfl: 11 .  senna-docusate (SENOKOT-S) 8.6-50 MG tablet, Take 2 tablets by mouth 2 (two) times daily. (Patient not taking: Reported on 11/29/2015), Disp: 90 tablet, Rfl: 2  Current Facility-Administered Medications:  .  medroxyPROGESTERone (DEPO-PROVERA) injection 150 mg, 150 mg, Intramuscular, Q90 days, Brock BadHarper, Dwan Fennel A, MD, 150 mg at 05/05/16 1120 No Known Allergies  Social History  Substance Use Topics   . Smoking status: Never Smoker  . Smokeless tobacco: Never Used  . Alcohol use No    Family History  Problem Relation Age of Onset  . Hypertension Maternal Grandmother   . Hypertension Maternal Grandfather       Review of Systems  Constitutional: negative for fatigue and weight loss Respiratory: negative for cough and wheezing Cardiovascular: negative for chest pain, fatigue and palpitations Gastrointestinal: negative for abdominal pain and change in bowel habits Musculoskeletal:negative for myalgias Neurological: negative for gait problems and tremors Behavioral/Psych: negative for abusive relationship, depression Endocrine: negative for temperature intolerance    Genitourinary:negative for abnormal menstrual periods, genital lesions, hot flashes, sexual problems and vaginal discharge Integument/breast: negative for breast lump, breast tenderness, nipple discharge and skin lesion(s)    Objective:       BP 119/76   Pulse 68   Ht 5\' 3"  (1.6 m)   Wt 125 lb (56.7 kg)   Breastfeeding? No   BMI 22.14 kg/m  General:   alert  Skin:   no rash or abnormalities  Lungs:   clear to auscultation bilaterally  Heart:   regular rate and rhythm, S1, S2 normal, no murmur, click, rub or gallop  Breasts:   normal without suspicious masses, skin or nipple changes or axillary nodes  Abdomen:  normal findings: no organomegaly, soft, non-tender and no hernia  Pelvis:  External genitalia: normal general appearance Urinary system: urethral meatus normal and bladder without fullness, nontender Vaginal: normal without tenderness, induration or masses Cervix: normal appearance Adnexa: normal bimanual exam Uterus: anteverted and non-tender, normal size   Lab Review Urine pregnancy test Labs reviewed yes Radiologic studies reviewed no  50% of 20 min visit spent on counseling and coordination of care.    Assessment and Plan:     1. Encounter for routine gynecological examination with  Papanicolaou smear of cervix Rx: - Cytology - PAP  2. Encounter for surveillance of injectable contraceptive   3. Iron deficiency anemia, unspecified iron deficiency anemia type Rx: - Prenatal-DSS-FeCb-FeGl-FA (CITRANATAL BLOOM) 90-1 MG TABS; Take 1 tablet by mouth daily before breakfast.  Dispense: 30 tablet; Refill: 11  4. Vaginal discharge Rx: - Cervicovaginal ancillary only   Plan:    Education reviewed: calcium supplements, depression evaluation, low fat, low cholesterol diet, safe sex/STD prevention, self breast exams and weight bearing exercise. Contraception: Depo-Provera injections. Follow up in: 1 year.   Meds ordered this encounter  Medications  . Prenatal-DSS-FeCb-FeGl-FA (CITRANATAL BLOOM) 90-1 MG TABS    Sig: Take 1 tablet by mouth daily before breakfast.    Dispense:  30 tablet    Refill:  11   No orders of the defined types were placed in this encounter.

## 2016-08-31 ENCOUNTER — Telehealth: Payer: Self-pay

## 2016-08-31 NOTE — Telephone Encounter (Signed)
-----   Message from Brock Bad, MD sent at 08/27/2016  4:09 PM EDT ----- Flagyl Rx for BV

## 2016-08-31 NOTE — Telephone Encounter (Signed)
Patient notified of results and RX 

## 2016-10-20 ENCOUNTER — Ambulatory Visit: Payer: Medicaid Other

## 2016-11-15 ENCOUNTER — Encounter (HOSPITAL_COMMUNITY): Payer: Self-pay

## 2016-11-15 ENCOUNTER — Inpatient Hospital Stay (HOSPITAL_COMMUNITY)
Admission: AD | Admit: 2016-11-15 | Discharge: 2016-11-15 | Disposition: A | Discharge status: Home / Self Care | Payer: Medicaid Other | Source: Ambulatory Visit | Attending: Family Medicine | Admitting: Family Medicine

## 2016-11-15 DIAGNOSIS — R1030 Lower abdominal pain, unspecified: Secondary | ICD-10-CM

## 2016-11-15 DIAGNOSIS — Z79891 Long term (current) use of opiate analgesic: Secondary | ICD-10-CM | POA: Insufficient documentation

## 2016-11-15 DIAGNOSIS — Z79899 Other long term (current) drug therapy: Secondary | ICD-10-CM | POA: Diagnosis not present

## 2016-11-15 DIAGNOSIS — N926 Irregular menstruation, unspecified: Secondary | ICD-10-CM

## 2016-11-15 DIAGNOSIS — D649 Anemia, unspecified: Secondary | ICD-10-CM | POA: Insufficient documentation

## 2016-11-15 DIAGNOSIS — Z8249 Family history of ischemic heart disease and other diseases of the circulatory system: Secondary | ICD-10-CM | POA: Insufficient documentation

## 2016-11-15 DIAGNOSIS — Z791 Long term (current) use of non-steroidal anti-inflammatories (NSAID): Secondary | ICD-10-CM | POA: Diagnosis not present

## 2016-11-15 LAB — URINALYSIS, ROUTINE W REFLEX MICROSCOPIC
BILIRUBIN URINE: NEGATIVE
Glucose, UA: NEGATIVE mg/dL
Hgb urine dipstick: NEGATIVE
KETONES UR: NEGATIVE mg/dL
LEUKOCYTES UA: NEGATIVE
Nitrite: NEGATIVE
PROTEIN: NEGATIVE mg/dL
Specific Gravity, Urine: 1.024 (ref 1.005–1.030)
pH: 7 (ref 5.0–8.0)

## 2016-11-15 LAB — WET PREP, GENITAL
Clue Cells Wet Prep HPF POC: NONE SEEN
SPERM: NONE SEEN
Trich, Wet Prep: NONE SEEN
YEAST WET PREP: NONE SEEN

## 2016-11-15 LAB — POCT PREGNANCY, URINE: PREG TEST UR: NEGATIVE

## 2016-11-15 NOTE — MAU Note (Addendum)
Pt states period ended 2 days ago, lasted 2 weeks.  C/O lower abd pain, worse with walking or changing positions.  Pt on depo, last injection in September.

## 2016-11-15 NOTE — MAU Provider Note (Signed)
History     CSN: [id number] Arrival date and time: 11/15/16 1752  First Provider Initiated Contact with Patient 11/15/16 1828      Chief Complaint  Patient presents with  . Abdominal Pain    HPI: Hannah Patel is a 26 y.o. G2P2002 who presents to maternity admissions reporting lower abdominal pain. She reports she had vaginal bleeding lasting 2 weeks, and it ended about 2 days ago; since then she has had lower abdominal pain at times. She reports pain is mostly with movement, when she walks or changes positions. Denies any other symptoms, including no abnormal vaginal discharge, urinary frequency, dysuria, hematuria, nausea, vomiting, diarrhea, constipation, fever or chills. She is on Depo for contraceptions, last dose 2 months ago.   Past obstetric history: OB History  Gravida Para Term Preterm AB Living  2 2 2     2   SAB TAB Ectopic Multiple Live Births        0 2    # Outcome Date GA Lbr Len/2nd Weight Sex Delivery Anes PTL Lv  2 Term 10/31/15 [redacted]w[redacted]d 11:30 / 00:11 6 lb 4.9 oz (2.86 kg) F Vag-Spont Local  LIV     Birth Comments: none  1 Term 09/23/14 [redacted]w[redacted]d  6 lb 8 oz (2.948 kg) F Vag-Spont None N LIV      Past Medical History:  Diagnosis Date  . Anemia     Past Surgical History:  Procedure Laterality Date  . NO PAST SURGERIES      Family History  Problem Relation Age of Onset  . Hypertension Maternal Grandmother   . Hypertension Maternal Grandfather     Social History   Tobacco Use  . Smoking status: Never Smoker  . Smokeless tobacco: Never Used  Substance Use Topics  . Alcohol use: No  . Drug use: No    Allergies: No Known Allergies  Facility-Administered Medications Prior to Admission  Medication Dose Route Frequency Provider Last Rate Last Dose  . medroxyPROGESTERone (DEPO-PROVERA) injection 150 mg  150 mg Intramuscular Q90 days Brock Bad, MD   150 mg at 05/05/16 1120   Medications Prior to Admission  Medication Sig Dispense Refill Last Dose  .  coconut oil OIL Apply 1 application topically as needed. (Patient not taking: Reported on 11/29/2015) 500 mL PRN Not Taking  . cyclobenzaprine (FLEXERIL) 5 MG tablet Take 1 tablet (5 mg total) by mouth 3 (three) times daily as needed for muscle spasms. (Patient not taking: Reported on 02/11/2016) 20 tablet 0 Not Taking  . doxycycline (VIBRAMYCIN) 100 MG capsule Take 1 capsule (100 mg total) by mouth 2 (two) times daily. (Patient not taking: Reported on 08/26/2016) 14 capsule 0 Not Taking  . ibuprofen (ADVIL,MOTRIN) 600 MG tablet Take 1 tablet (600 mg total) by mouth every 8 (eight) hours as needed. (Patient not taking: Reported on 02/11/2016) 30 tablet 2 Not Taking  . Iron Polysacch Cmplx-B12-FA 150-0.025-1 MG CAPS Take 1 tablet by mouth daily. (Patient not taking: Reported on 11/29/2015) 30 each 4 Not Taking  . medroxyPROGESTERone (DEPO-PROVERA) 150 MG/ML injection Inject 1 mL (150 mg total) into the muscle every 3 (three) months. 1 mL 4 Taking  . medroxyPROGESTERone (PROVERA) 10 MG tablet Take 1 tablet (10 mg total) by mouth daily. (Patient not taking: Reported on 08/26/2016) 30 tablet 0 Not Taking  . metroNIDAZOLE (FLAGYL) 500 MG tablet Take 1 tablet (500 mg total) by mouth 2 (two) times daily. 14 tablet 2   . oxyCODONE-acetaminophen (ROXICET) 5-325 MG tablet  Take 1-2 tablets by mouth every 4 (four) hours as needed for severe pain. (Patient not taking: Reported on 11/29/2015) 30 tablet 0 Not Taking  . Prenat-FePoly-Metf-FA-DHA-DSS (VITAFOL FE+) 90-1-200 & 50 MG CPPK Take 2 tablets by mouth daily. (Patient not taking: Reported on 02/11/2016) 60 each 12 Not Taking  . Prenatal-DSS-FeCb-FeGl-FA (CITRANATAL BLOOM) 90-1 MG TABS Take 1 tablet by mouth daily before breakfast. 30 tablet 11   . senna-docusate (SENOKOT-S) 8.6-50 MG tablet Take 2 tablets by mouth 2 (two) times daily. (Patient not taking: Reported on 11/29/2015) 90 tablet 2 Not Taking    Review of Systems - Negative except for what is mentioned in  HPI.  Physical Exam   Blood pressure 115/72, pulse 71, temperature 98 F (36.7 C), temperature source Oral, resp. rate 18, height 5\' 3"  (1.6 m), weight 130 lb (59 kg), last menstrual period 10/30/2016, not currently breastfeeding.  Constitutional: Well-developed, well-nourished female in no acute distress.  HENT: [place]/AT, normal oropharynx mucosa. MMM Eyes: normal conjunctivae, no scleral icterus Cardiovascular: normal rate, regular rhythm Respiratory: normal effort, lungs CTAB.  GI: Abd soft, non-distended, +BS. Mild TTP over suprapubic region.  GU: Neg CVAT. Pelvic: NEFG. Some mild CMT on bimanual exam. No adnexal tenderness, fullness or masses.  MSK: Extremities nontender, no edema, normal ROM Neurologic: Alert and oriented x 4. Psych: Normal mood and affect Skin: warm and dry    MAU Course/MDM:  Procedures  Pt seen and examined as above. VS reviewed, wnl. Neg UPT UA wnl Wet prep negative GC/Chlamydia collected.  Dicussed with patients normal results and and ddx. Feels comfortable with plan of taking ibuprofen q8h and return precautions.   Assessment and Plan  Assessment: 1. Lower abdominal pain   2. Irregular menses     Plan: --Discharge home in stable condition.  --Ibuprofen prn pain    Elena Davia, Kandra NicolasJulie P, MD 11/15/2016 6:43 PM

## 2016-11-15 NOTE — Progress Notes (Addendum)
G2P2. Stated bleeding for past two weeks. Bleeding ended 2 days ago and ever since has had abd pain when walking or changing positions. Last Depo injection was done in September  UPT negative  1831: provider at bs assessing pt. GC wet prep and pelvic done.   1922: Discharge instructions given with pt understanding. Pt left unit via ambulatory.

## 2016-11-15 NOTE — Discharge Instructions (Signed)
--  Take ibuprofen every 8 hours with food for the next 5 days --Return to MAU or seek medical care if your symptoms become worse, or you have fever.

## 2016-11-16 LAB — GC/CHLAMYDIA PROBE AMP (~~LOC~~) NOT AT ARMC
CHLAMYDIA, DNA PROBE: NEGATIVE
Neisseria Gonorrhea: NEGATIVE

## 2016-12-02 ENCOUNTER — Ambulatory Visit (INDEPENDENT_AMBULATORY_CARE_PROVIDER_SITE_OTHER): Payer: Medicaid Other

## 2016-12-02 ENCOUNTER — Other Ambulatory Visit: Payer: Self-pay | Admitting: Certified Nurse Midwife

## 2016-12-02 VITALS — BP 111/72 | HR 82 | Ht 63.0 in | Wt 128.3 lb

## 2016-12-02 DIAGNOSIS — Z3042 Encounter for surveillance of injectable contraceptive: Secondary | ICD-10-CM | POA: Diagnosis not present

## 2016-12-02 DIAGNOSIS — Z3202 Encounter for pregnancy test, result negative: Secondary | ICD-10-CM | POA: Diagnosis not present

## 2016-12-02 LAB — POCT URINE PREGNANCY: PREG TEST UR: NEGATIVE

## 2016-12-02 MED ORDER — MEDROXYPROGESTERONE ACETATE 150 MG/ML IM SUSP
150.0000 mg | Freq: Once | INTRAMUSCULAR | Status: AC
  Administered 2016-12-02: 150 mg via INTRAMUSCULAR

## 2016-12-02 MED ORDER — MEDROXYPROGESTERONE ACETATE 150 MG/ML IM SUSP
150.0000 mg | INTRAMUSCULAR | 0 refills | Status: DC
Start: 2016-12-02 — End: 2016-12-02

## 2016-12-02 NOTE — Progress Notes (Signed)
Presents for UPT/DEPO.  Missed her last shot. LMP 11/24/16.  UPT today is Negative. DEPO given in left deltoid, tolerated well.  Next DEPO 2/6-20/2019.  Administrations This Visit    medroxyPROGESTERone (DEPO-PROVERA) injection 150 mg    Admin Date 12/02/2016 Action Given Dose 150 mg Route Intramuscular Administered By Maretta BeesMcGlashan, Keyna Blizard J, RMA

## 2017-02-18 ENCOUNTER — Ambulatory Visit (INDEPENDENT_AMBULATORY_CARE_PROVIDER_SITE_OTHER): Payer: Self-pay

## 2017-02-18 ENCOUNTER — Telehealth: Payer: Self-pay | Admitting: *Deleted

## 2017-02-18 DIAGNOSIS — Z3202 Encounter for pregnancy test, result negative: Secondary | ICD-10-CM

## 2017-02-18 DIAGNOSIS — Z3042 Encounter for surveillance of injectable contraceptive: Secondary | ICD-10-CM

## 2017-02-18 LAB — POCT URINE PREGNANCY: Preg Test, Ur: NEGATIVE

## 2017-02-18 MED ORDER — MEDROXYPROGESTERONE ACETATE 150 MG/ML IM SUSP
150.0000 mg | Freq: Once | INTRAMUSCULAR | Status: AC
  Administered 2017-02-18: 150 mg via INTRAMUSCULAR

## 2017-02-18 NOTE — Telephone Encounter (Signed)
error 

## 2017-02-18 NOTE — Progress Notes (Signed)
Pt came for her depo shot, pt was within her window. Pt tolerated her injection well. She will return 4/25-5-9.

## 2017-05-13 ENCOUNTER — Ambulatory Visit (INDEPENDENT_AMBULATORY_CARE_PROVIDER_SITE_OTHER): Payer: Self-pay

## 2017-05-13 DIAGNOSIS — Z3042 Encounter for surveillance of injectable contraceptive: Secondary | ICD-10-CM

## 2017-05-13 MED ORDER — MEDROXYPROGESTERONE ACETATE 150 MG/ML IM SUSP
150.0000 mg | Freq: Once | INTRAMUSCULAR | Status: AC
Start: 2017-05-13 — End: 2017-05-13
  Administered 2017-05-13: 150 mg via INTRAMUSCULAR

## 2017-05-13 NOTE — Progress Notes (Signed)
I have reviewed the chart and agree with nursing staff's documentation of this patient's encounter.  Catalina Antigua, MD 05/13/2017 4:25 PM

## 2017-05-13 NOTE — Progress Notes (Signed)
Pt here for depo shot. Pt is within her window. Inj given in left deltoid. Pt tolerated well. Next depo due July 18/ Aug 1.

## 2017-08-02 ENCOUNTER — Ambulatory Visit (INDEPENDENT_AMBULATORY_CARE_PROVIDER_SITE_OTHER): Payer: Self-pay

## 2017-08-02 DIAGNOSIS — Z3042 Encounter for surveillance of injectable contraceptive: Secondary | ICD-10-CM

## 2017-08-02 NOTE — Progress Notes (Signed)
Nurse visit for pt supply Depo given L del w/o difficulty. Pt on time for injection. Next Depo due 10/7-10/21.

## 2017-10-16 ENCOUNTER — Other Ambulatory Visit: Payer: Self-pay | Admitting: Obstetrics & Gynecology

## 2017-10-19 ENCOUNTER — Ambulatory Visit (INDEPENDENT_AMBULATORY_CARE_PROVIDER_SITE_OTHER): Payer: Self-pay

## 2017-10-19 VITALS — BP 119/80 | HR 66 | Wt 128.6 lb

## 2017-10-19 DIAGNOSIS — Z3042 Encounter for surveillance of injectable contraceptive: Secondary | ICD-10-CM

## 2017-10-19 MED ORDER — MEDROXYPROGESTERONE ACETATE 150 MG/ML IM SUSP
150.0000 mg | Freq: Once | INTRAMUSCULAR | Status: AC
  Administered 2017-10-19: 150 mg via INTRAMUSCULAR

## 2017-10-19 NOTE — Progress Notes (Signed)
Presents for DEPO, given in RD, tolerated well.  Next DEPO 12/24-01/07/2018.  Administrations This Visit    medroxyPROGESTERone (DEPO-PROVERA) injection 150 mg    Admin Date 10/19/2017 Action Given Dose 150 mg Route Intramuscular Administered By Maretta Bees, RMA

## 2018-01-09 ENCOUNTER — Other Ambulatory Visit: Payer: Self-pay | Admitting: Obstetrics & Gynecology

## 2018-01-10 ENCOUNTER — Encounter: Payer: Self-pay | Admitting: Obstetrics

## 2018-01-10 ENCOUNTER — Ambulatory Visit: Payer: Self-pay | Admitting: Obstetrics

## 2018-01-10 VITALS — BP 115/63 | HR 79 | Ht 63.0 in | Wt 132.6 lb

## 2018-01-10 DIAGNOSIS — N898 Other specified noninflammatory disorders of vagina: Secondary | ICD-10-CM

## 2018-01-10 DIAGNOSIS — Z113 Encounter for screening for infections with a predominantly sexual mode of transmission: Secondary | ICD-10-CM

## 2018-01-10 DIAGNOSIS — Z124 Encounter for screening for malignant neoplasm of cervix: Secondary | ICD-10-CM

## 2018-01-10 DIAGNOSIS — Z01419 Encounter for gynecological examination (general) (routine) without abnormal findings: Secondary | ICD-10-CM

## 2018-01-10 DIAGNOSIS — Z3042 Encounter for surveillance of injectable contraceptive: Secondary | ICD-10-CM

## 2018-01-10 MED ORDER — MEDROXYPROGESTERONE ACETATE 150 MG/ML IM SUSP
150.0000 mg | Freq: Once | INTRAMUSCULAR | Status: AC
  Administered 2018-01-10: 150 mg via INTRAMUSCULAR

## 2018-01-10 NOTE — Progress Notes (Signed)
Pt presents for AEX. Last pap 08/26/16. Normal.   Pt is due for depo today. She is within her window. Inj given in left deltoid. Pt tolerated well.

## 2018-01-11 ENCOUNTER — Encounter: Payer: Self-pay | Admitting: Obstetrics

## 2018-01-11 NOTE — Progress Notes (Signed)
Subjective:        Hannah Patel is a 27 y.o. female here for a routine exam.  Current complaints: None.    Personal health questionnaire:  Is patient Ashkenazi Jewish, have a family history of breast and/or ovarian cancer: no Is there a family history of uterine cancer diagnosed at age < 2750, gastrointestinal cancer, urinary tract cancer, family member who is a Personnel officerLynch syndrome-associated carrier: no Is the patient overweight and hypertensive, family history of diabetes, personal history of gestational diabetes, preeclampsia or PCOS: no Is patient over 255, have PCOS,  family history of premature CHD under age 25, diabetes, smoke, have hypertension or peripheral artery disease:  no At any time, has a partner hit, kicked or otherwise hurt or frightened you?: no Over the past 2 weeks, have you felt down, depressed or hopeless?: no Over the past 2 weeks, have you felt little interest or pleasure in doing things?:no   Gynecologic History No LMP recorded. Patient has had an injection. Contraception: Depo-Provera injections Last Pap: 2018. Results were: normal Last mammogram: n/a. Results were: n/a  Obstetric History OB History  Gravida Para Term Preterm AB Living  2 2 2     2   SAB TAB Ectopic Multiple Live Births        0 2    # Outcome Date GA Lbr Len/2nd Weight Sex Delivery Anes PTL Lv  2 Term 10/31/15 7639w4d 11:30 / 00:11 6 lb 4.9 oz (2.86 kg) F Vag-Spont Local  LIV     Birth Comments: none  1 Term 09/23/14 4042w0d  6 lb 8 oz (2.948 kg) F Vag-Spont None N LIV    Past Medical History:  Diagnosis Date  . Anemia     Past Surgical History:  Procedure Laterality Date  . NO PAST SURGERIES       Current Outpatient Medications:  .  coconut oil OIL, Apply 1 application topically as needed., Disp: 500 mL, Rfl: PRN .  ibuprofen (ADVIL,MOTRIN) 600 MG tablet, Take 1 tablet (600 mg total) by mouth every 8 (eight) hours as needed., Disp: 30 tablet, Rfl: 2 .  medroxyPROGESTERone  (DEPO-PROVERA) 150 MG/ML injection, INJECT 1ML INTO THE MUSCLE EVERY 3 MONTHS, Disp: 1 mL, Rfl: 2 .  medroxyPROGESTERone (DEPO-PROVERA) 150 MG/ML injection, INJECT 1 ML (150 MG TOTAL) INTO THE MUSCLE EVERY 3 (THREE) MONTHS., Disp: 1 mL, Rfl: 0  Current Facility-Administered Medications:  .  medroxyPROGESTERone (DEPO-PROVERA) injection 150 mg, 150 mg, Intramuscular, Q90 days, Brock BadHarper, Joselyn Edling A, MD, 150 mg at 08/02/17 0901 No Known Allergies  Social History   Tobacco Use  . Smoking status: Never Smoker  . Smokeless tobacco: Never Used  Substance Use Topics  . Alcohol use: No    Family History  Problem Relation Age of Onset  . Hypertension Maternal Grandmother   . Hypertension Maternal Grandfather       Review of Systems  Constitutional: negative for fatigue and weight loss Respiratory: negative for cough and wheezing Cardiovascular: negative for chest pain, fatigue and palpitations Gastrointestinal: negative for abdominal pain and change in bowel habits Musculoskeletal:negative for myalgias Neurological: negative for gait problems and tremors Behavioral/Psych: negative for abusive relationship, depression Endocrine: negative for temperature intolerance    Genitourinary:negative for abnormal menstrual periods, genital lesions, hot flashes, sexual problems and vaginal discharge Integument/breast: negative for breast lump, breast tenderness, nipple discharge and skin lesion(s)    Objective:       BP 115/63   Pulse 79   Ht 5\' 3"  (1.6  m)   Wt 132 lb 9.6 oz (60.1 kg)   BMI 23.49 kg/m  General:   alert  Skin:   no rash or abnormalities  Lungs:   clear to auscultation bilaterally  Heart:   regular rate and rhythm, S1, S2 normal, no murmur, click, rub or gallop  Breasts:   normal without suspicious masses, skin or nipple changes or axillary nodes  Abdomen:  normal findings: no organomegaly, soft, non-tender and no hernia  Pelvis:  External genitalia: normal general  appearance Urinary system: urethral meatus normal and bladder without fullness, nontender Vaginal: normal without tenderness, induration or masses Cervix: normal appearance Adnexa: normal bimanual exam Uterus: anteverted and non-tender, normal size   Lab Review Urine pregnancy test Labs reviewed yes Radiologic studies reviewed no  50% of 20 min visit spent on counseling and coordination of care.   Assessment:    Healthy female exam.    Plan:    Education reviewed: calcium supplements, depression evaluation, low fat, low cholesterol diet, safe sex/STD prevention, self breast exams and weight bearing exercise. Contraception: Depo-Provera injections. Follow up in: 1 year.   Meds ordered this encounter  Medications  . medroxyPROGESTERone (DEPO-PROVERA) injection 150 mg   No orders of the defined types were placed in this encounter.   Brock BadHARLES A. Jayin Derousse MD 01-10-2018

## 2018-01-11 NOTE — Addendum Note (Signed)
Addended by: Marya LandryFOSTER, Zaxton Angerer D on: 01/11/2018 08:15 AM   Modules accepted: Orders

## 2018-01-13 LAB — CYTOLOGY - PAP: DIAGNOSIS: NEGATIVE

## 2018-01-14 LAB — CERVICOVAGINAL ANCILLARY ONLY
Bacterial vaginitis: NEGATIVE
Candida vaginitis: NEGATIVE
Chlamydia: NEGATIVE
Neisseria Gonorrhea: NEGATIVE
Trichomonas: NEGATIVE

## 2018-02-16 IMAGING — US US MFM OB COMP +14 WKS
1 series · 14 of 28 positions shown · non-contrast
Comparison: none

[Series 1: us mfm ob comp +14 wks · 58 acquisitions, 14 frames shown]
[im 3/58]
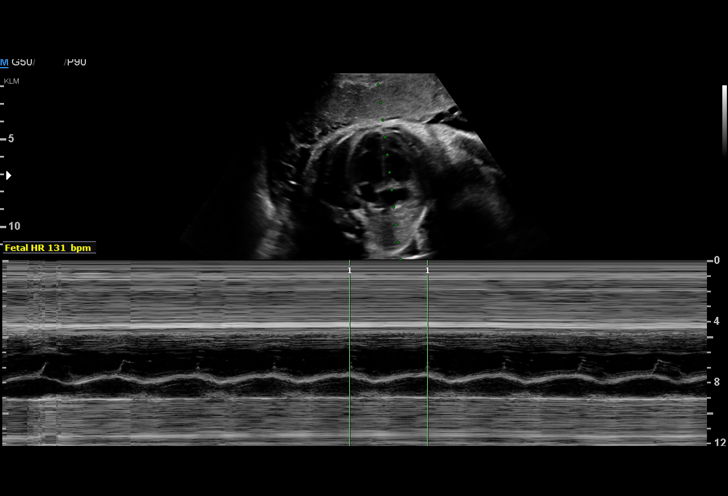
[im 7/58]
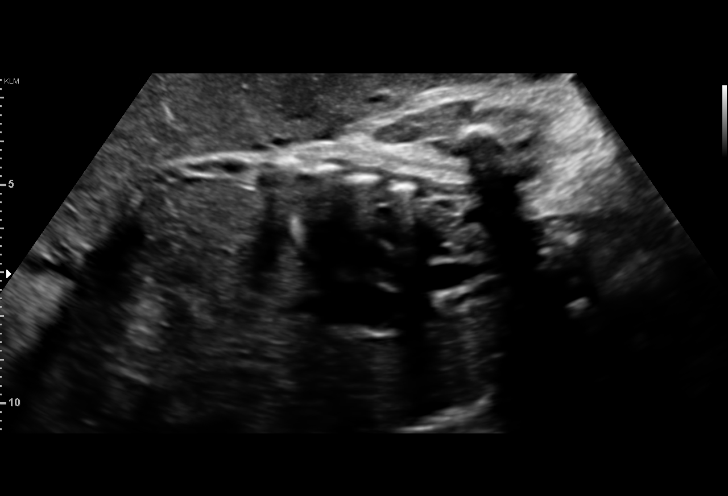
[im 11/58]
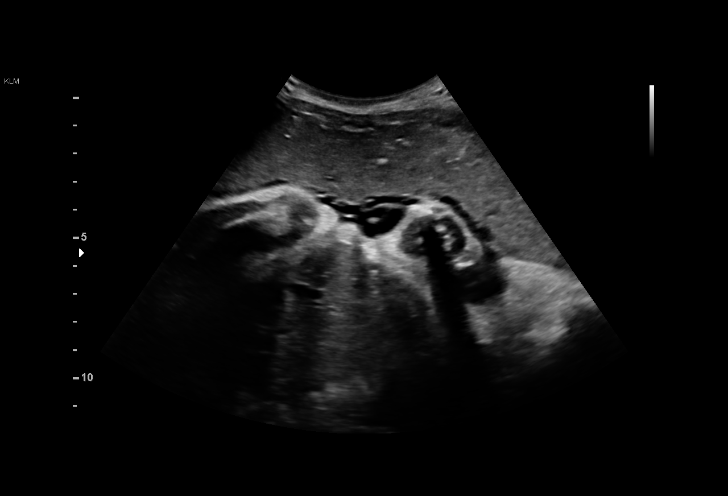
[im 15/58]
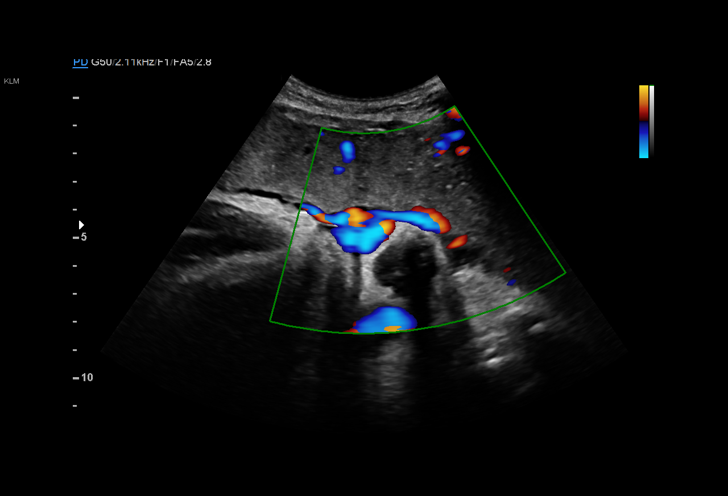
[im 20/58]
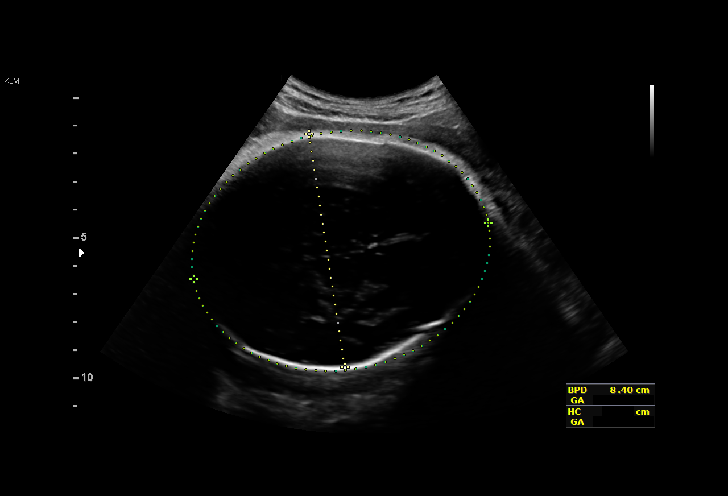
[im 24/58]
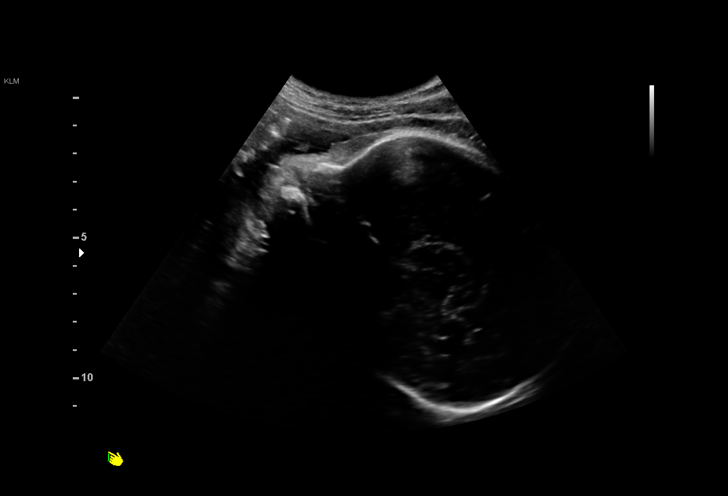
[im 28/58]
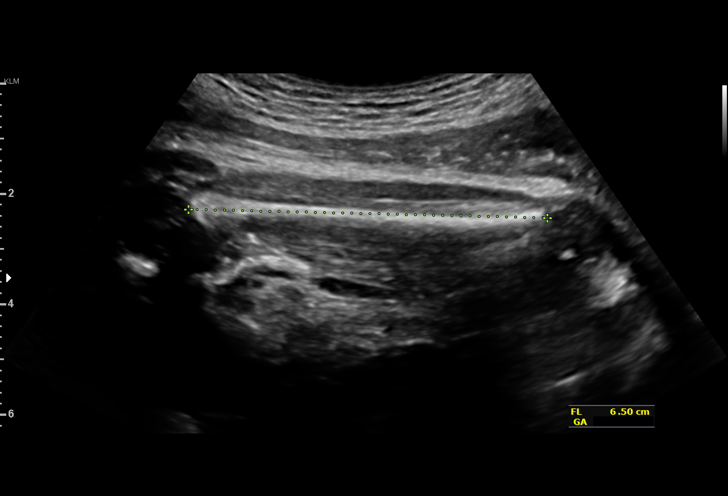
[im 32/58]
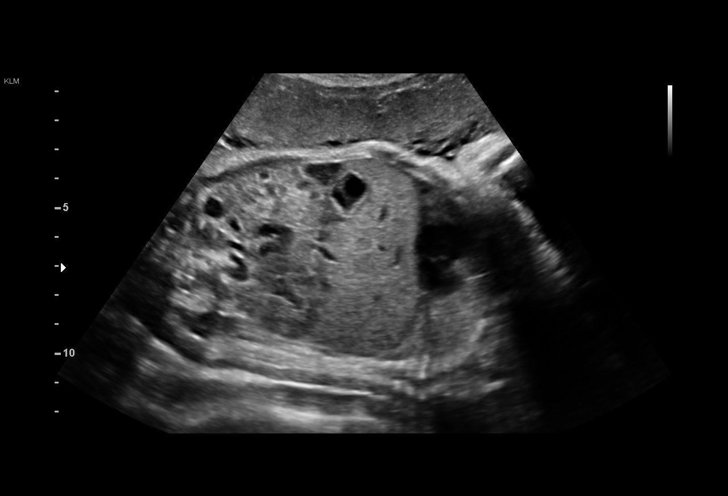
[im 36/58]
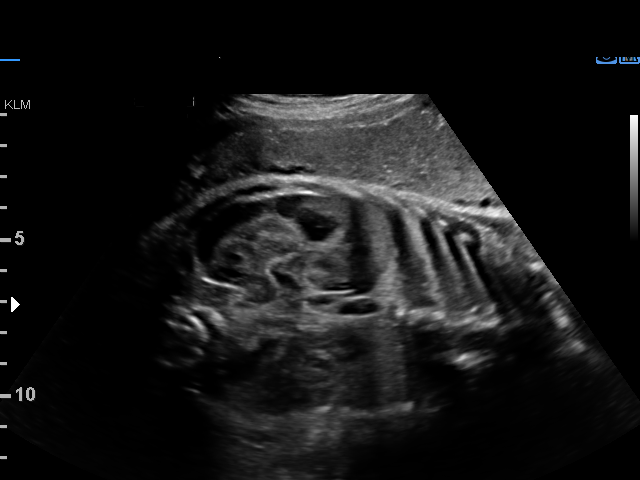
[im 41/58]
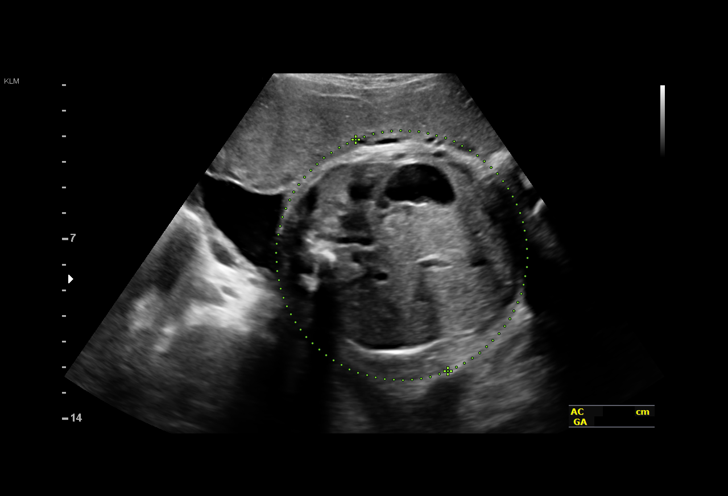
[im 45/58]
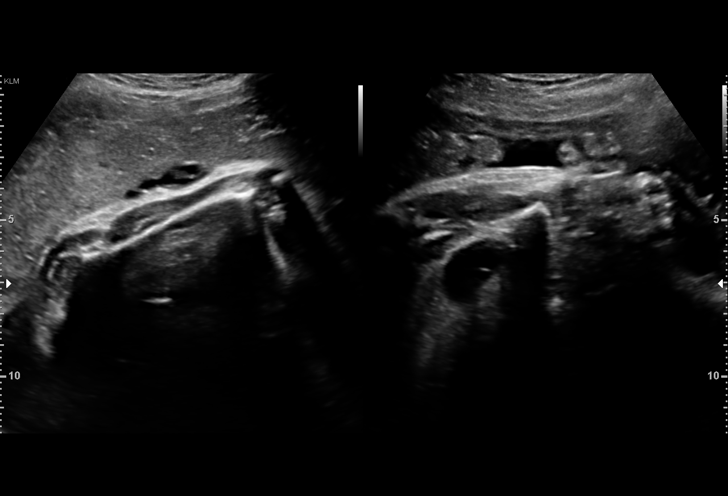
[im 49/58]
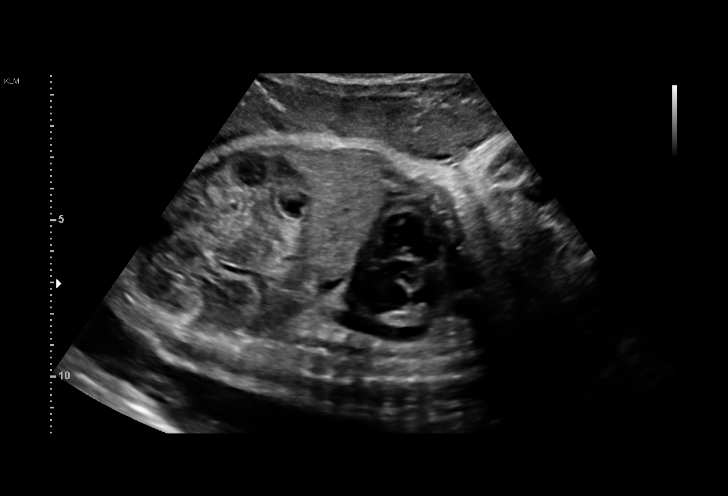
[im 53/58]
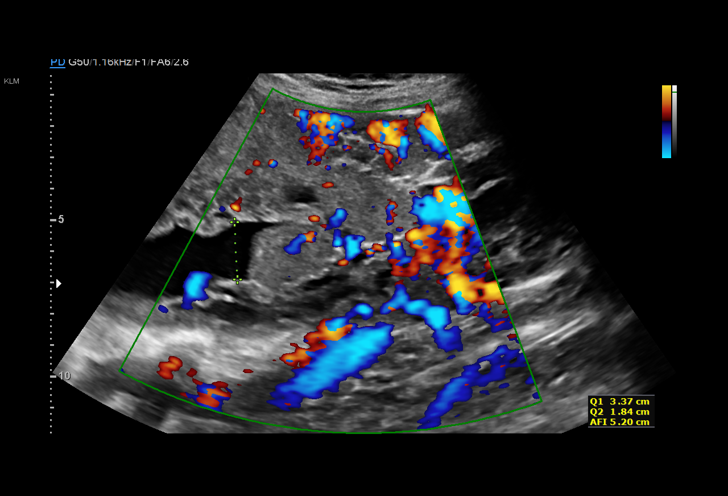
[im 58/58]
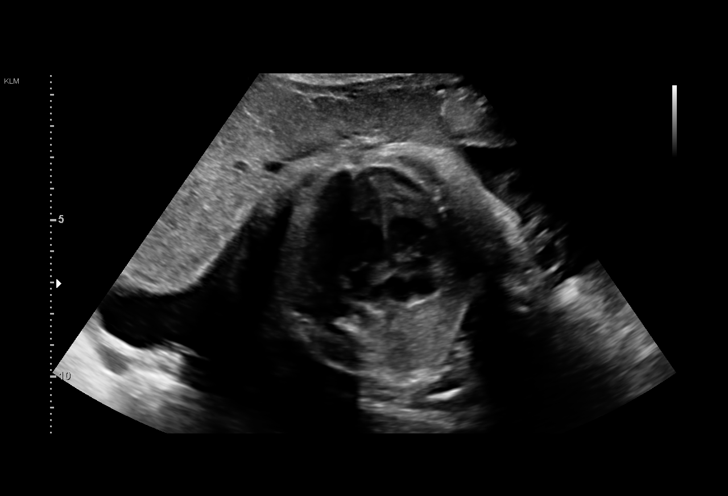

[14 of 28 positions shown; findings below may reference images not displayed]

Road [HOSPITAL]

Indications

34 weeks gestation of pregnancy
Basic anatomic survey                          Z36
Uterine size-date discrepancy, third trimester
OB History

Gravidity:    2         Term:   1        Prem:   0        SAB:   0
TOP:          0       Ectopic:  0        Living: 1
Fetal Evaluation

Num Of Fetuses:     1
Fetal Heart         131
Rate(bpm):
Cardiac Activity:   Observed
Presentation:       Cephalic
Placenta:           Anterior, above cervical os
P. Cord Insertion:  Visualized

Amniotic Fluid
AFI FV:      Subjectively within normal limits

AFI Sum(cm)     %Tile       Largest Pocket(cm)
10.6            24

RUQ(cm)       RLQ(cm)       LUQ(cm)        LLQ(cm)
3.4
Biometry

BPD:      83.8  mm     G. Age:  33w 5d         23  %    CI:        75.95   %   70 - 86
FL/HC:      21.5   %   20.1 -
HC:      304.8  mm     G. Age:  33w 6d          7  %    HC/AC:      0.99       0.93 -
AC:      308.1  mm     G. Age:  34w 5d         57  %    FL/BPD:     78.0   %   71 - 87
FL:       65.4  mm     G. Age:  33w 5d         19  %    FL/AC:      21.2   %   20 - 24

Est. FW:    4724  gm      5 lb 4 oz     52  %
Gestational Age

LMP:           32w 3d       Date:   02/19/15                 EDD:   11/26/15
U/S Today:     34w 0d                                        EDD:   11/15/15
Best:          34w 5d    Det. By:   Early Ultrasound         EDD:   11/10/15
(05/30/15)
Anatomy

Cranium:               Appears normal         Aortic Arch:            Not well visualized
Cavum:                 Appears normal         Ductal Arch:            Appears normal
Ventricles:            Appears normal         Diaphragm:              Appears normal
Choroid Plexus:        Appears normal         Stomach:                Appears normal, left
sided
Cerebellum:            Appears normal         Abdomen:                Appears normal
Posterior Fossa:       Appears normal         Abdominal Wall:         Not well visualized
Nuchal Fold:           Not applicable (>20    Cord Vessels:           Appears normal (3
wks GA)                                        vessel cord)
Face:                  Orbits nl; profile not Kidneys:                Appear normal
well visualized
Lips:                  Not well visualized    Bladder:                Appears normal
Thoracic:              Appears normal         Spine:                  Not well visualized
Heart:                 Appears normal         Upper Extremities:      Visualized
(4CH, axis, and situs
RVOT:                  Appears normal         Lower Extremities:      Visualized
LVOT:                  Appears normal

Other:  Fetus appears to be a female.
Cervix Uterus Adnexa

Cervix
Not visualized (advanced GA >49wks)
Impression

Singleton gestation at 34+5 weeks
Size less than dates on clinical exam
Cephalic, with anterior placenta. AFI normal at 10.6cm
Anatomic survey normal. some views worrisome for
pericardial effusion, but not reproducible and appear to be
artifact
Growth is consistent with dating, with baby growing in the
52nd percentile
Recommendations

Follow-up ultrasounds as clinically indicated.

## 2018-04-04 ENCOUNTER — Other Ambulatory Visit: Payer: Self-pay

## 2018-04-04 ENCOUNTER — Telehealth: Payer: Self-pay | Admitting: *Deleted

## 2018-04-04 ENCOUNTER — Ambulatory Visit: Payer: Self-pay

## 2018-04-04 ENCOUNTER — Other Ambulatory Visit: Payer: Self-pay | Admitting: Obstetrics

## 2018-04-04 DIAGNOSIS — Z3042 Encounter for surveillance of injectable contraceptive: Secondary | ICD-10-CM

## 2018-04-04 MED ORDER — MEDROXYPROGESTERONE ACETATE 150 MG/ML IM SUSP: 150.0000 mg | INTRAMUSCULAR | 3 refills | Status: DC

## 2018-04-04 NOTE — Telephone Encounter (Signed)
Rx sent to pt pharmacy 

## 2018-04-04 NOTE — Progress Notes (Signed)
Rx sent pt has Nurse visit today. Refill was not at the pharmacy.

## 2018-04-04 NOTE — Telephone Encounter (Signed)
Patient called stating she called the pharmacy and they told her no refills are available, the pharmacy is waiting for Dr Clearance Coots to send in her refills. Please advise    Patient requested a call back from the nurse once this is refilled. [facility] on [address]

## 2018-06-01 ENCOUNTER — Other Ambulatory Visit: Payer: Self-pay

## 2018-06-01 DIAGNOSIS — N898 Other specified noninflammatory disorders of vagina: Secondary | ICD-10-CM

## 2018-06-01 DIAGNOSIS — B9689 Other specified bacterial agents as the cause of diseases classified elsewhere: Secondary | ICD-10-CM

## 2018-06-01 DIAGNOSIS — N939 Abnormal uterine and vaginal bleeding, unspecified: Secondary | ICD-10-CM

## 2018-06-01 MED ORDER — METRONIDAZOLE 500 MG PO TABS: 500.0000 mg | ORAL_TABLET | Freq: Two times a day (BID) | ORAL | 0 refills | Status: DC

## 2018-06-01 NOTE — Progress Notes (Signed)
Pt called stating she is having vaginal discharge for a couple days with a foul odor, she states that this happens to her after having intercourse sometimes. I advised pt I would send medication in for her to treat this but to call back if symptoms do not improve. Pt verbalizes understanding.

## 2018-06-01 NOTE — Progress Notes (Signed)
Rx flagyl will not go through to pharmacy, trying a different pharmacy.

## 2018-08-01 ENCOUNTER — Telehealth: Payer: Self-pay

## 2018-08-01 MED ORDER — METRONIDAZOLE 500 MG PO TABS: 500.0000 mg | ORAL_TABLET | Freq: Two times a day (BID) | ORAL | 0 refills | Status: DC

## 2018-08-01 NOTE — Telephone Encounter (Signed)
Returned call, s/w pt, complaining of vaginal d/c and irritation, requests rx for BV.

## 2018-09-12 ENCOUNTER — Other Ambulatory Visit: Payer: Self-pay | Admitting: *Deleted

## 2018-09-12 MED ORDER — METRONIDAZOLE 500 MG PO TABS: 500.0000 mg | ORAL_TABLET | Freq: Two times a day (BID) | ORAL | 0 refills | Status: DC

## 2018-09-12 NOTE — Telephone Encounter (Signed)
Patient called to request metronidazole refill. She has completed her menstrual cycle.  Derl Barrow, RN

## 2018-10-26 ENCOUNTER — Telehealth: Payer: Self-pay

## 2018-10-26 DIAGNOSIS — B9689 Other specified bacterial agents as the cause of diseases classified elsewhere: Secondary | ICD-10-CM

## 2018-10-26 MED ORDER — METRONIDAZOLE 500 MG PO TABS: 500.0000 mg | ORAL_TABLET | Freq: Two times a day (BID) | ORAL | 0 refills | Status: DC

## 2018-10-26 NOTE — Telephone Encounter (Signed)
Pt called requesting a refill for flagyl. She states that she is experiencing vaginal discharge with odor. Rx sent to pharmacy per protocol.

## 2019-01-03 ENCOUNTER — Other Ambulatory Visit: Payer: Self-pay

## 2019-01-03 DIAGNOSIS — B9689 Other specified bacterial agents as the cause of diseases classified elsewhere: Secondary | ICD-10-CM

## 2019-01-03 DIAGNOSIS — N760 Acute vaginitis: Secondary | ICD-10-CM

## 2019-01-03 MED ORDER — METRONIDAZOLE 500 MG PO TABS: 500.0000 mg | ORAL_TABLET | Freq: Two times a day (BID) | ORAL | 0 refills | Status: DC

## 2019-01-03 NOTE — Progress Notes (Signed)
Rx sent per protocol 

## 2019-01-04 ENCOUNTER — Other Ambulatory Visit: Payer: Self-pay

## 2019-01-04 DIAGNOSIS — B9689 Other specified bacterial agents as the cause of diseases classified elsewhere: Secondary | ICD-10-CM

## 2019-01-04 DIAGNOSIS — N760 Acute vaginitis: Secondary | ICD-10-CM

## 2019-01-04 MED ORDER — METRONIDAZOLE 500 MG PO TABS: 500.0000 mg | ORAL_TABLET | Freq: Two times a day (BID) | ORAL | 0 refills | Status: DC

## 2019-01-30 ENCOUNTER — Telehealth: Payer: Self-pay | Admitting: Obstetrics

## 2019-01-30 DIAGNOSIS — B9689 Other specified bacterial agents as the cause of diseases classified elsewhere: Secondary | ICD-10-CM

## 2019-01-30 MED ORDER — METRONIDAZOLE 500 MG PO TABS: 500.0000 mg | ORAL_TABLET | Freq: Two times a day (BID) | ORAL | 0 refills | Status: DC

## 2019-01-30 NOTE — Telephone Encounter (Signed)
Patient called, she accidentally threw away her Flagyl after only taking one dose.  One refill sent to pharmacy.  Patient advised that if symptoms persist, she will need to be seen.

## 2019-03-27 ENCOUNTER — Other Ambulatory Visit: Payer: Self-pay | Admitting: Obstetrics

## 2019-04-03 ENCOUNTER — Other Ambulatory Visit: Payer: Self-pay

## 2019-04-03 ENCOUNTER — Telehealth: Payer: Self-pay

## 2019-04-03 DIAGNOSIS — B9689 Other specified bacterial agents as the cause of diseases classified elsewhere: Secondary | ICD-10-CM

## 2019-04-03 DIAGNOSIS — N760 Acute vaginitis: Secondary | ICD-10-CM

## 2019-04-03 MED ORDER — METRONIDAZOLE 500 MG PO TABS: 500.0000 mg | ORAL_TABLET | Freq: Two times a day (BID) | ORAL | 0 refills | Status: AC

## 2019-04-03 NOTE — Telephone Encounter (Signed)
TC from pt requesting BV Rx refill Pt states after every cycle she has BV  Rx Last sent 01/2019  Last note stated pt would need to be seen if symptoms came back Pt was told after scheduling Annual to request refill Pt advised of protocol  since pt was told she could have Rx  Additional  Rx sent for BV  Pt has annual appt coming up.

## 2019-04-24 ENCOUNTER — Ambulatory Visit: Payer: Self-pay | Admitting: Obstetrics

## 2019-06-21 ENCOUNTER — Other Ambulatory Visit: Payer: Self-pay | Admitting: Obstetrics

## 2020-01-02 ENCOUNTER — Ambulatory Visit: Payer: Self-pay | Admitting: Obstetrics

## 2020-02-21 ENCOUNTER — Ambulatory Visit: Payer: Self-pay | Admitting: Obstetrics
